# Patient Record
Sex: Female | Born: 1945
Health system: Southern US, Community
[De-identification: ages and names within clinical notes are randomized; demographics above are authoritative.]

## PROBLEM LIST (undated history)

## (undated) DIAGNOSIS — Z9889 Other specified postprocedural states: Secondary | ICD-10-CM

## (undated) DIAGNOSIS — R7303 Prediabetes: Secondary | ICD-10-CM

## (undated) DIAGNOSIS — N2889 Other specified disorders of kidney and ureter: Secondary | ICD-10-CM

## (undated) DIAGNOSIS — N201 Calculus of ureter: Secondary | ICD-10-CM

## (undated) DIAGNOSIS — C801 Malignant (primary) neoplasm, unspecified: Secondary | ICD-10-CM

## (undated) DIAGNOSIS — Z87442 Personal history of urinary calculi: Secondary | ICD-10-CM

## (undated) DIAGNOSIS — R112 Nausea with vomiting, unspecified: Secondary | ICD-10-CM

## (undated) DIAGNOSIS — I700 Atherosclerosis of aorta: Secondary | ICD-10-CM

## (undated) DIAGNOSIS — I10 Essential (primary) hypertension: Secondary | ICD-10-CM

## (undated) DIAGNOSIS — E785 Hyperlipidemia, unspecified: Secondary | ICD-10-CM

## (undated) DIAGNOSIS — R3915 Urgency of urination: Secondary | ICD-10-CM

## (undated) DIAGNOSIS — J189 Pneumonia, unspecified organism: Secondary | ICD-10-CM

## (undated) DIAGNOSIS — M1990 Unspecified osteoarthritis, unspecified site: Secondary | ICD-10-CM

## (undated) DIAGNOSIS — N189 Chronic kidney disease, unspecified: Secondary | ICD-10-CM

## (undated) HISTORY — DX: Atherosclerosis of aorta: I70.0

## (undated) HISTORY — PX: EYE SURGERY: SHX253

## (undated) HISTORY — PX: EXTRACORPOREAL SHOCK WAVE LITHOTRIPSY: SHX1557

## (undated) HISTORY — DX: Hyperlipidemia, unspecified: E78.5

## (undated) HISTORY — PX: CYSTOSCOPY WITH STENT PLACEMENT: SHX5790

## (undated) HISTORY — PX: ABDOMINAL HYSTERECTOMY: SHX81

## (undated) HISTORY — PX: BREAST SURGERY: SHX581

---

## 1976-05-19 HISTORY — PX: KIDNEY SURGERY: SHX687

## 1981-05-19 HISTORY — PX: DILATION AND CURETTAGE OF UTERUS: SHX78

## 1982-05-19 HISTORY — PX: ABDOMINAL HYSTERECTOMY: SHX81

## 1984-05-19 HISTORY — PX: BREAST SURGERY: SHX581

## 2001-01-10 ENCOUNTER — Emergency Department (HOSPITAL_COMMUNITY): Admission: EM | Admit: 2001-01-10 | Discharge: 2001-01-11 | Payer: Self-pay | Admitting: Emergency Medicine

## 2001-01-15 ENCOUNTER — Emergency Department (HOSPITAL_COMMUNITY): Admission: EM | Admit: 2001-01-15 | Discharge: 2001-01-15 | Payer: Self-pay | Admitting: Emergency Medicine

## 2006-04-25 ENCOUNTER — Encounter: Admission: RE | Admit: 2006-04-25 | Discharge: 2006-04-25 | Payer: Self-pay | Admitting: Sports Medicine

## 2006-05-19 HISTORY — PX: ROTATOR CUFF REPAIR: SHX139

## 2009-05-19 HISTORY — PX: LITHOTRIPSY: SUR834

## 2009-10-22 ENCOUNTER — Ambulatory Visit (HOSPITAL_BASED_OUTPATIENT_CLINIC_OR_DEPARTMENT_OTHER): Admission: RE | Admit: 2009-10-22 | Discharge: 2009-10-22 | Payer: Self-pay | Admitting: Urology

## 2009-10-25 ENCOUNTER — Ambulatory Visit (HOSPITAL_COMMUNITY)
Admission: RE | Admit: 2009-10-25 | Discharge: 2009-10-25 | Payer: Self-pay | Source: Home / Self Care | Admitting: Urology

## 2010-08-05 LAB — URINALYSIS, ROUTINE W REFLEX MICROSCOPIC
Bilirubin Urine: NEGATIVE
Ketones, ur: NEGATIVE mg/dL
Nitrite: NEGATIVE
Urobilinogen, UA: 0.2 mg/dL (ref 0.0–1.0)
pH: 7 (ref 5.0–8.0)

## 2010-08-05 LAB — POCT HEMOGLOBIN-HEMACUE: Hemoglobin: 14.2 g/dL (ref 12.0–15.0)

## 2010-08-05 LAB — URINE MICROSCOPIC-ADD ON

## 2011-07-29 DIAGNOSIS — M722 Plantar fascial fibromatosis: Secondary | ICD-10-CM | POA: Diagnosis not present

## 2011-07-29 DIAGNOSIS — M7730 Calcaneal spur, unspecified foot: Secondary | ICD-10-CM | POA: Diagnosis not present

## 2011-08-19 DIAGNOSIS — M722 Plantar fascial fibromatosis: Secondary | ICD-10-CM | POA: Diagnosis not present

## 2011-09-04 DIAGNOSIS — H2510 Age-related nuclear cataract, unspecified eye: Secondary | ICD-10-CM | POA: Diagnosis not present

## 2011-09-04 DIAGNOSIS — H26019 Infantile and juvenile cortical, lamellar, or zonular cataract, unspecified eye: Secondary | ICD-10-CM | POA: Diagnosis not present

## 2011-10-10 DIAGNOSIS — M722 Plantar fascial fibromatosis: Secondary | ICD-10-CM | POA: Diagnosis not present

## 2011-10-10 DIAGNOSIS — Z79899 Other long term (current) drug therapy: Secondary | ICD-10-CM | POA: Diagnosis not present

## 2011-11-07 DIAGNOSIS — M722 Plantar fascial fibromatosis: Secondary | ICD-10-CM | POA: Diagnosis not present

## 2011-11-12 DIAGNOSIS — M722 Plantar fascial fibromatosis: Secondary | ICD-10-CM | POA: Diagnosis not present

## 2011-11-17 DIAGNOSIS — M722 Plantar fascial fibromatosis: Secondary | ICD-10-CM | POA: Diagnosis not present

## 2011-11-19 DIAGNOSIS — M722 Plantar fascial fibromatosis: Secondary | ICD-10-CM | POA: Diagnosis not present

## 2011-11-24 DIAGNOSIS — M722 Plantar fascial fibromatosis: Secondary | ICD-10-CM | POA: Diagnosis not present

## 2011-11-26 DIAGNOSIS — M722 Plantar fascial fibromatosis: Secondary | ICD-10-CM | POA: Diagnosis not present

## 2011-11-28 DIAGNOSIS — M722 Plantar fascial fibromatosis: Secondary | ICD-10-CM | POA: Diagnosis not present

## 2011-12-01 DIAGNOSIS — M722 Plantar fascial fibromatosis: Secondary | ICD-10-CM | POA: Diagnosis not present

## 2011-12-03 DIAGNOSIS — M722 Plantar fascial fibromatosis: Secondary | ICD-10-CM | POA: Diagnosis not present

## 2011-12-05 DIAGNOSIS — M722 Plantar fascial fibromatosis: Secondary | ICD-10-CM | POA: Diagnosis not present

## 2011-12-08 DIAGNOSIS — M722 Plantar fascial fibromatosis: Secondary | ICD-10-CM | POA: Diagnosis not present

## 2011-12-19 DIAGNOSIS — S93409A Sprain of unspecified ligament of unspecified ankle, initial encounter: Secondary | ICD-10-CM | POA: Diagnosis not present

## 2011-12-19 DIAGNOSIS — M779 Enthesopathy, unspecified: Secondary | ICD-10-CM | POA: Diagnosis not present

## 2012-01-16 DIAGNOSIS — M779 Enthesopathy, unspecified: Secondary | ICD-10-CM | POA: Diagnosis not present

## 2012-02-11 DIAGNOSIS — M7750 Other enthesopathy of unspecified foot: Secondary | ICD-10-CM | POA: Diagnosis not present

## 2012-02-11 DIAGNOSIS — M779 Enthesopathy, unspecified: Secondary | ICD-10-CM | POA: Diagnosis not present

## 2012-02-11 DIAGNOSIS — S93409A Sprain of unspecified ligament of unspecified ankle, initial encounter: Secondary | ICD-10-CM | POA: Diagnosis not present

## 2012-02-11 DIAGNOSIS — M19079 Primary osteoarthritis, unspecified ankle and foot: Secondary | ICD-10-CM | POA: Diagnosis not present

## 2012-02-12 ENCOUNTER — Other Ambulatory Visit: Payer: Self-pay

## 2012-02-12 DIAGNOSIS — R52 Pain, unspecified: Secondary | ICD-10-CM

## 2012-02-16 ENCOUNTER — Ambulatory Visit
Admission: RE | Admit: 2012-02-16 | Discharge: 2012-02-16 | Disposition: A | Discharge status: Home / Self Care | Payer: Medicare Other | Source: Ambulatory Visit

## 2012-02-16 DIAGNOSIS — M19079 Primary osteoarthritis, unspecified ankle and foot: Secondary | ICD-10-CM | POA: Diagnosis not present

## 2012-02-16 DIAGNOSIS — R52 Pain, unspecified: Secondary | ICD-10-CM

## 2012-02-27 DIAGNOSIS — M779 Enthesopathy, unspecified: Secondary | ICD-10-CM | POA: Diagnosis not present

## 2012-03-19 DIAGNOSIS — M19079 Primary osteoarthritis, unspecified ankle and foot: Secondary | ICD-10-CM | POA: Diagnosis not present

## 2012-03-19 DIAGNOSIS — M779 Enthesopathy, unspecified: Secondary | ICD-10-CM | POA: Diagnosis not present

## 2012-03-19 DIAGNOSIS — Z79899 Other long term (current) drug therapy: Secondary | ICD-10-CM | POA: Diagnosis not present

## 2012-04-29 DIAGNOSIS — N200 Calculus of kidney: Secondary | ICD-10-CM | POA: Diagnosis not present

## 2012-04-29 DIAGNOSIS — N3941 Urge incontinence: Secondary | ICD-10-CM | POA: Diagnosis not present

## 2012-05-04 DIAGNOSIS — M779 Enthesopathy, unspecified: Secondary | ICD-10-CM | POA: Diagnosis not present

## 2012-05-04 DIAGNOSIS — M19079 Primary osteoarthritis, unspecified ankle and foot: Secondary | ICD-10-CM | POA: Diagnosis not present

## 2012-05-19 HISTORY — PX: FOOT SURGERY: SHX648

## 2012-06-02 DIAGNOSIS — M779 Enthesopathy, unspecified: Secondary | ICD-10-CM | POA: Diagnosis not present

## 2012-06-02 DIAGNOSIS — M19079 Primary osteoarthritis, unspecified ankle and foot: Secondary | ICD-10-CM | POA: Diagnosis not present

## 2012-06-04 DIAGNOSIS — I10 Essential (primary) hypertension: Secondary | ICD-10-CM | POA: Diagnosis not present

## 2012-06-18 DIAGNOSIS — I10 Essential (primary) hypertension: Secondary | ICD-10-CM | POA: Diagnosis not present

## 2012-06-18 DIAGNOSIS — Z01818 Encounter for other preprocedural examination: Secondary | ICD-10-CM | POA: Diagnosis not present

## 2012-07-07 DIAGNOSIS — E876 Hypokalemia: Secondary | ICD-10-CM | POA: Diagnosis not present

## 2012-07-07 DIAGNOSIS — I10 Essential (primary) hypertension: Secondary | ICD-10-CM | POA: Diagnosis not present

## 2012-07-08 DIAGNOSIS — D485 Neoplasm of uncertain behavior of skin: Secondary | ICD-10-CM | POA: Diagnosis not present

## 2012-07-08 DIAGNOSIS — L570 Actinic keratosis: Secondary | ICD-10-CM | POA: Diagnosis not present

## 2012-07-26 DIAGNOSIS — M19079 Primary osteoarthritis, unspecified ankle and foot: Secondary | ICD-10-CM | POA: Diagnosis not present

## 2012-07-26 DIAGNOSIS — I10 Essential (primary) hypertension: Secondary | ICD-10-CM | POA: Diagnosis not present

## 2012-07-26 DIAGNOSIS — M25579 Pain in unspecified ankle and joints of unspecified foot: Secondary | ICD-10-CM | POA: Diagnosis not present

## 2012-07-26 DIAGNOSIS — M779 Enthesopathy, unspecified: Secondary | ICD-10-CM | POA: Diagnosis not present

## 2012-07-26 DIAGNOSIS — M205X9 Other deformities of toe(s) (acquired), unspecified foot: Secondary | ICD-10-CM | POA: Diagnosis not present

## 2012-08-04 DIAGNOSIS — M779 Enthesopathy, unspecified: Secondary | ICD-10-CM | POA: Diagnosis not present

## 2012-08-19 DIAGNOSIS — L570 Actinic keratosis: Secondary | ICD-10-CM | POA: Diagnosis not present

## 2012-08-31 DIAGNOSIS — M779 Enthesopathy, unspecified: Secondary | ICD-10-CM | POA: Diagnosis not present

## 2012-09-28 DIAGNOSIS — M779 Enthesopathy, unspecified: Secondary | ICD-10-CM | POA: Diagnosis not present

## 2012-10-04 DIAGNOSIS — H2510 Age-related nuclear cataract, unspecified eye: Secondary | ICD-10-CM | POA: Diagnosis not present

## 2012-10-04 DIAGNOSIS — H02829 Cysts of unspecified eye, unspecified eyelid: Secondary | ICD-10-CM | POA: Diagnosis not present

## 2012-11-23 DIAGNOSIS — G589 Mononeuropathy, unspecified: Secondary | ICD-10-CM | POA: Diagnosis not present

## 2012-11-23 DIAGNOSIS — R609 Edema, unspecified: Secondary | ICD-10-CM | POA: Diagnosis not present

## 2012-11-23 DIAGNOSIS — M722 Plantar fascial fibromatosis: Secondary | ICD-10-CM | POA: Diagnosis not present

## 2012-12-17 DIAGNOSIS — M779 Enthesopathy, unspecified: Secondary | ICD-10-CM | POA: Diagnosis not present

## 2012-12-17 DIAGNOSIS — M79609 Pain in unspecified limb: Secondary | ICD-10-CM | POA: Diagnosis not present

## 2012-12-17 DIAGNOSIS — M19079 Primary osteoarthritis, unspecified ankle and foot: Secondary | ICD-10-CM | POA: Diagnosis not present

## 2012-12-17 DIAGNOSIS — M722 Plantar fascial fibromatosis: Secondary | ICD-10-CM | POA: Diagnosis not present

## 2013-02-11 DIAGNOSIS — M722 Plantar fascial fibromatosis: Secondary | ICD-10-CM | POA: Diagnosis not present

## 2013-02-11 DIAGNOSIS — M19079 Primary osteoarthritis, unspecified ankle and foot: Secondary | ICD-10-CM | POA: Diagnosis not present

## 2013-02-11 DIAGNOSIS — M7730 Calcaneal spur, unspecified foot: Secondary | ICD-10-CM | POA: Diagnosis not present

## 2013-02-11 DIAGNOSIS — M779 Enthesopathy, unspecified: Secondary | ICD-10-CM | POA: Diagnosis not present

## 2013-03-18 ENCOUNTER — Ambulatory Visit (INDEPENDENT_AMBULATORY_CARE_PROVIDER_SITE_OTHER): Payer: Medicare Other

## 2013-03-18 VITALS — BP 162/79 | HR 64 | Resp 12 | Ht 64.0 in | Wt 190.0 lb

## 2013-03-18 DIAGNOSIS — M79609 Pain in unspecified limb: Secondary | ICD-10-CM

## 2013-03-18 DIAGNOSIS — M1990 Unspecified osteoarthritis, unspecified site: Secondary | ICD-10-CM

## 2013-03-18 DIAGNOSIS — M722 Plantar fascial fibromatosis: Secondary | ICD-10-CM | POA: Diagnosis not present

## 2013-03-18 MED ORDER — PREDNISONE 10 MG PO KIT: 1.0000 | PACK | Freq: Four times a day (QID) | ORAL | Status: DC

## 2013-03-18 NOTE — Progress Notes (Signed)
  Subjective:    Patient ID: Elaine Harris, female    DOB: 1945-08-26, 67 y.o.   MRN: [id number]  HPI Comments: '' B/L HEEL ARE SORE, THE RT. FOOT IS WORSE' ' I WEAR MY ORTHOTICS, ICING, AND IBUFROPEN, BUT IS NOT GETTING BETTER''  patient is status post lateral column fusion on the right foot continues to have some stiffness and soreness is not constant but is aggravated with certain activities. She is wearing boots which is working out in the yard and maintains use of her orthotics. Continues to have significant pain on palpation Magan plantar fascia medial calcaneal tubercle right significantly worse than left at today's presentation.    Review of Systems  Constitutional: Negative.   Eyes: Positive for visual disturbance.  Respiratory: Negative.   Cardiovascular: Negative.   Gastrointestinal: Negative.   Endocrine: Negative.   Genitourinary: Negative.   Musculoskeletal: Negative.   Skin: Negative.   Allergic/Immunologic: Negative.   Neurological: Negative.   Hematological: Negative.   Psychiatric/Behavioral: Negative.        Objective:   Physical Exam Neurovascular status is intact. Pedal pulses palpable DP and PT +2/4 bilateral. Capillary refill time 3 seconds all digits. Epicritic and proprioceptive sensations intact and symmetric bilateral with normal plantar response and DTRs noted. Orthopedic biomechanical exam reveals rectus foot type mild flexible digital contractures noted. Slight cavus foot type left more so than right. Patient definitely has some arthropathy the forefoot midfoot area exacerbated by walking and likely with weather changes especially with cold and wet weather setting in.    Assessment & Plan:  Plantar fasciitis/heel spur syndrome bilateral right significantly worse than left. Fascial strapping applied to both feet at this time. Patient placed in a Sterapred Deas Dosepak x12 days will also maintain ice pack to the inferior heel is bilateral maintain  current orthoses recheck again in 3 or 4 weeks for followup future treatment may consist of steroid injections may discuss other options including shockwave her a pep therapies or even surgical intervention may need to be discussed.  Alvan Dame DPM

## 2013-03-18 NOTE — Patient Instructions (Signed)

## 2013-03-24 ENCOUNTER — Other Ambulatory Visit: Payer: Self-pay

## 2013-04-19 DIAGNOSIS — N200 Calculus of kidney: Secondary | ICD-10-CM | POA: Diagnosis not present

## 2013-04-22 ENCOUNTER — Ambulatory Visit: Payer: Medicare Other

## 2013-06-24 DIAGNOSIS — I10 Essential (primary) hypertension: Secondary | ICD-10-CM | POA: Diagnosis not present

## 2013-06-24 DIAGNOSIS — L680 Hirsutism: Secondary | ICD-10-CM | POA: Diagnosis not present

## 2013-10-21 DIAGNOSIS — Z23 Encounter for immunization: Secondary | ICD-10-CM | POA: Diagnosis not present

## 2013-10-21 DIAGNOSIS — Z1331 Encounter for screening for depression: Secondary | ICD-10-CM | POA: Diagnosis not present

## 2013-10-21 DIAGNOSIS — Z1211 Encounter for screening for malignant neoplasm of colon: Secondary | ICD-10-CM | POA: Diagnosis not present

## 2013-10-21 DIAGNOSIS — Z6836 Body mass index (BMI) 36.0-36.9, adult: Secondary | ICD-10-CM | POA: Diagnosis not present

## 2013-10-21 DIAGNOSIS — I10 Essential (primary) hypertension: Secondary | ICD-10-CM | POA: Diagnosis not present

## 2013-10-21 DIAGNOSIS — Z0000 Encounter for general adult medical examination without abnormal findings: Secondary | ICD-10-CM | POA: Diagnosis not present

## 2013-10-21 DIAGNOSIS — Z780 Asymptomatic menopausal state: Secondary | ICD-10-CM | POA: Diagnosis not present

## 2013-10-26 ENCOUNTER — Other Ambulatory Visit: Payer: Self-pay

## 2013-10-26 DIAGNOSIS — Z1231 Encounter for screening mammogram for malignant neoplasm of breast: Secondary | ICD-10-CM

## 2013-10-31 DIAGNOSIS — Z1211 Encounter for screening for malignant neoplasm of colon: Secondary | ICD-10-CM | POA: Diagnosis not present

## 2013-11-01 ENCOUNTER — Ambulatory Visit
Admission: RE | Admit: 2013-11-01 | Discharge: 2013-11-01 | Disposition: A | Discharge status: Home / Self Care | Payer: Medicare Other | Source: Ambulatory Visit

## 2013-11-01 DIAGNOSIS — Z1231 Encounter for screening mammogram for malignant neoplasm of breast: Secondary | ICD-10-CM

## 2013-11-04 DIAGNOSIS — Z1211 Encounter for screening for malignant neoplasm of colon: Secondary | ICD-10-CM | POA: Diagnosis not present

## 2013-11-24 DIAGNOSIS — H26019 Infantile and juvenile cortical, lamellar, or zonular cataract, unspecified eye: Secondary | ICD-10-CM | POA: Diagnosis not present

## 2013-11-24 DIAGNOSIS — H02829 Cysts of unspecified eye, unspecified eyelid: Secondary | ICD-10-CM | POA: Diagnosis not present

## 2013-11-24 DIAGNOSIS — H2510 Age-related nuclear cataract, unspecified eye: Secondary | ICD-10-CM | POA: Diagnosis not present

## 2013-11-28 DIAGNOSIS — Z780 Asymptomatic menopausal state: Secondary | ICD-10-CM | POA: Diagnosis not present

## 2014-04-21 DIAGNOSIS — M2550 Pain in unspecified joint: Secondary | ICD-10-CM | POA: Diagnosis not present

## 2014-04-21 DIAGNOSIS — N200 Calculus of kidney: Secondary | ICD-10-CM | POA: Diagnosis not present

## 2014-04-21 DIAGNOSIS — I10 Essential (primary) hypertension: Secondary | ICD-10-CM | POA: Diagnosis not present

## 2014-04-28 DIAGNOSIS — R312 Other microscopic hematuria: Secondary | ICD-10-CM | POA: Diagnosis not present

## 2014-04-28 DIAGNOSIS — N200 Calculus of kidney: Secondary | ICD-10-CM | POA: Diagnosis not present

## 2014-05-19 HISTORY — PX: CATARACT EXTRACTION W/ INTRAOCULAR LENS  IMPLANT, BILATERAL: SHX1307

## 2014-07-07 ENCOUNTER — Other Ambulatory Visit: Payer: Self-pay | Admitting: Urology

## 2014-07-07 DIAGNOSIS — N200 Calculus of kidney: Secondary | ICD-10-CM | POA: Diagnosis not present

## 2014-07-07 DIAGNOSIS — R312 Other microscopic hematuria: Secondary | ICD-10-CM | POA: Diagnosis not present

## 2014-07-12 ENCOUNTER — Encounter (HOSPITAL_COMMUNITY): Payer: Self-pay

## 2014-07-14 NOTE — H&P (Signed)
Reason For Visit Here today for left ESWLsecondary to stones in the renal pelvis.  Active Problems Problems  1. Microscopic hematuria (R31.2)   Assessed By: Jimmey Ralph (Urology); Last Assessed: 07 Jul 2014 2. Nephrolithiasis (N20.0)   Assessed By: Jimmey Ralph (Urology); Last Assessed: 07 Jul 2014  History of Present Illness 69 YO female patient of Dr. Cy Blamer seen today for a 2 month f/u.     GU Hx:  In 2011, she had placement of a double-J stent for approximately a 16 mm stone in her left ureteropelvic junction, causing hydronephrosis. She subsequently had lithotripsy. The majority of the stone passed, but she was known to have a small fragment in the lower pole of her left kidney. She also had an untreated 9-10 mm stone that was laterally based in the mid pole of the kidney. Believe that it is in a calyx out laterally and probably is not going to migrate. When she was here 2014, she was doing well and there did not appear to be much change over several years. She continued to have a stone or potentially 2 stones in the lower pole of the left kidney, which appeared to be about 4 mm in size. She has had occasional achiness in her left flank, but nothing that has been problematic or consistent. Urinalysis today does show some microhematuria.    KUB Dec 2015. She continues to have a dense 9-10 mm laterally based stone in the mid pole of the left kidney, unchanged. The fragment that was in her lower pole, now appears to be more in the area of the renal pelvis. It is unclear whether there are 2 adjacent stones or 1 larger stone. In total, the size appears to be around 4 mm x 8 mm.     Feb 2016 Interval Hx:   Today states no real change since last visit. Denies worsening flank pain or hematuria. Has not seen any stone material.   Past Medical History Problems  1. History of Borderline osteopenia (M85.80)  Surgical History Problems  1. History of Cystoscopy With Insertion Of  Ureteral Stent Left 2. History of Hysterectomy 3. History of Lithotripsy 4. History of Oophorectomy 5. History of Shoulder Surgery  Current Meds 1. Calcium + D TABS;  Therapy: (Recorded:11Dec2015) to Recorded 2. Fish Oil CAPS;  Therapy: (Recorded:12May2011) to Recorded 3. Lisinopril 20 MG Oral Tablet;  Therapy: 20Oct2014 to Recorded 4. Multi-Day TABS;  Therapy: (Recorded:12May2011) to Recorded  Allergies Medication  1. No Known Drug Allergies  Family History Problems  1. Family history of Acute Myocardial Infarction : Mother 2. Family history of Acute Myocardial Infarction : Father 3. Family history of Family Health Status Number Of Children   2 sons 4. Family history of Father Deceased At Age ____   53y/o, lung ca 5. Family history of Lung Cancer : Father 84. Family history of Mother Deceased At Age ____   84y/o  Social History Problems  1. Denied: History of Alcohol Use 2. Caffeine Use   1 qd 3. Marital History - Currently Married 4. Never A Smoker 5. Occupation:   retired  Review of Systems Genitourinary, constitutional, skin, eye, otolaryngeal, hematologic/lymphatic, cardiovascular, pulmonary, endocrine, musculoskeletal, gastrointestinal, neurological and psychiatric system(s) were reviewed and pertinent findings if present are noted and are otherwise negative.  Gastrointestinal: flank pain.    Vitals Vital Signs [Data Includes: Last 1 Day]  Recorded: [id number] 08:39AM  Blood Pressure: 149 / 82 Temperature: 97.7 F Heart Rate: 73  Physical Exam Constitutional:  Well nourished and well developed . No acute distress. The patient appears well hydrated.    Class I  Soft palate, anterior &posterior tonsil pillars and uvula present  Class II  Tonsil pillars & base of uvula hidden by base of tongue  Class III  Only soft palate visible  Class IV  Soft palate not visible ENT:1 . Class III1 .  Abdomen: The abdomen is mildly obese. The abdomen is soft and  nontender. No suprapubic tenderness. No CVA tenderness.  Skin: Normal skin turgor.  Neuro/Psych:. Mood and affect are appropriate.     1 Amended By: Jimmey Ralph; Jul 07 2014 11:25 AM EST  Results/Data Urine Belva Bertin Includes: Last 1 Day]   [id number]  COLOR YELLOW   APPEARANCE CLEAR   SPECIFIC GRAVITY 1.025   pH 6.0   GLUCOSE NEG mg/dL  BILIRUBIN NEG   KETONE NEG mg/dL  BLOOD TRACE   PROTEIN 30 mg/dL  UROBILINOGEN 0.2 mg/dL  NITRITE NEG   LEUKOCYTE ESTERASE NEG   SQUAMOUS EPITHELIAL/HPF MANY   WBC 3-6 WBC/hpf  RBC 3-6 RBC/hpf  BACTERIA FEW   CRYSTALS NONE SEEN   CASTS NONE SEEN    The following images/tracing/specimen were independently visualized:  KUB: shows stable 9 mm renal stone and now has 2 stones noted at UPJ. RUS: (limited) left: 12.54 X 1.16 X 5.14 X 5.29 cm. No hydronephrosis. Multiple stones noted. Mid renal pelvis cyst: 2.04 X 1.25 X 1.87 cm.  The following clinical lab reports were reviewed:  UA- with small amount of microscopic hematuria.  PVR: Ultrasound PVR 6.78. Bladder wall: 0.68 cm. ml.    Assessment Assessed  1. Nephrolithiasis (N20.0) 2. Microscopic hematuria (R31.2)  Plan Nephrolithiasis  1. Follow-up NP/PA [facility]  Follow-up  Status: Active  Requested for: [id number]  Will proceed with elective ESWL with Dr. Risa Grill.   Discussed with pt and husband. She is very leary about proceeding with any treatment option at this time. I told her to think about it and when [employer] (scheduler) calls her to scheduled she can let her know if she is going forward. If she chooses not to proceed will need to f/u in 3 months for OV/KUB w/Dr. Risa Grill.   Signatures Electronically signed by : Jimmey Ralph, Trey Paula; Jul 07 2014 11:26AM EST

## 2014-07-17 ENCOUNTER — Ambulatory Visit (HOSPITAL_COMMUNITY): Payer: Medicare Other

## 2014-07-17 ENCOUNTER — Ambulatory Visit (HOSPITAL_COMMUNITY)
Admission: RE | Admit: 2014-07-17 | Discharge: 2014-07-17 | Disposition: A | Discharge status: Home / Self Care | Payer: Medicare Other | Source: Ambulatory Visit | Attending: Urology | Admitting: Urology

## 2014-07-17 ENCOUNTER — Encounter (HOSPITAL_COMMUNITY): Payer: Self-pay | Admitting: *Deleted

## 2014-07-17 ENCOUNTER — Encounter (HOSPITAL_COMMUNITY)
Admission: RE | Disposition: A | Discharge status: Home / Self Care | Payer: Self-pay | Source: Ambulatory Visit | Attending: Urology

## 2014-07-17 DIAGNOSIS — I10 Essential (primary) hypertension: Secondary | ICD-10-CM | POA: Diagnosis not present

## 2014-07-17 DIAGNOSIS — Z01818 Encounter for other preprocedural examination: Secondary | ICD-10-CM | POA: Diagnosis not present

## 2014-07-17 DIAGNOSIS — N200 Calculus of kidney: Secondary | ICD-10-CM | POA: Diagnosis not present

## 2014-07-17 DIAGNOSIS — N201 Calculus of ureter: Secondary | ICD-10-CM

## 2014-07-17 HISTORY — DX: Other specified postprocedural states: Z98.890

## 2014-07-17 HISTORY — DX: Essential (primary) hypertension: I10

## 2014-07-17 HISTORY — DX: Chronic kidney disease, unspecified: N18.9

## 2014-07-17 HISTORY — DX: Other specified postprocedural states: R11.2

## 2014-07-17 SURGERY — LITHOTRIPSY, ESWL
Anesthesia: LOCAL | Laterality: Left

## 2014-07-17 MED ORDER — DEXTROSE-NACL 5-0.45 % IV SOLN
INTRAVENOUS | Status: DC
  Administered 2014-07-17: 09:00:00 via INTRAVENOUS

## 2014-07-17 MED ORDER — DIPHENHYDRAMINE HCL 25 MG PO CAPS
25.0000 mg | ORAL_CAPSULE | ORAL | Status: AC
  Administered 2014-07-17: 25 mg via ORAL
  Filled 2014-07-17: qty 1

## 2014-07-17 MED ORDER — DIAZEPAM 5 MG PO TABS
10.0000 mg | ORAL_TABLET | ORAL | Status: AC
  Administered 2014-07-17: 10 mg via ORAL
  Filled 2014-07-17: qty 2

## 2014-07-17 MED ORDER — CIPROFLOXACIN HCL 500 MG PO TABS
500.0000 mg | ORAL_TABLET | ORAL | Status: AC
  Administered 2014-07-17: 500 mg via ORAL
  Filled 2014-07-17: qty 1

## 2014-07-17 MED ORDER — HYDROCODONE-ACETAMINOPHEN 5-325 MG PO TABS: 1.0000 | ORAL_TABLET | Freq: Four times a day (QID) | ORAL | Status: DC | PRN

## 2014-07-17 NOTE — Interval H&P Note (Signed)
History and Physical Interval Note:  07/17/2014 9:31 AM  Elaine Harris  has presented today for surgery, with the diagnosis of LEFT URETERAL PELVIC JUNCTION STONE  The various methods of treatment have been discussed with the patient and family. After consideration of risks, benefits and other options for treatment, the patient has consented to  Procedure(s): LEFT EXTRACORPOREAL SHOCK WAVE LITHOTRIPSY (ESWL) (Left) as a surgical intervention .  The patient's history has been reviewed, patient examined, no change in status, stable for surgery.  I have reviewed the patient's chart and labs.  Questions were answered to the patient's satisfaction.     Elaine Harris S

## 2014-07-17 NOTE — Discharge Instructions (Signed)
See [facility] discharge instructions in chart.  

## 2014-07-17 NOTE — Op Note (Signed)
See [facility] OP note scanned into chart. 

## 2014-08-08 DIAGNOSIS — N200 Calculus of kidney: Secondary | ICD-10-CM | POA: Diagnosis not present

## 2014-08-21 DIAGNOSIS — N200 Calculus of kidney: Secondary | ICD-10-CM | POA: Diagnosis not present

## 2014-09-21 DIAGNOSIS — N200 Calculus of kidney: Secondary | ICD-10-CM | POA: Diagnosis not present

## 2014-09-26 ENCOUNTER — Other Ambulatory Visit: Payer: Self-pay

## 2014-09-26 DIAGNOSIS — Z1231 Encounter for screening mammogram for malignant neoplasm of breast: Secondary | ICD-10-CM

## 2014-10-27 DIAGNOSIS — Z0000 Encounter for general adult medical examination without abnormal findings: Secondary | ICD-10-CM | POA: Diagnosis not present

## 2014-10-27 DIAGNOSIS — Z1389 Encounter for screening for other disorder: Secondary | ICD-10-CM | POA: Diagnosis not present

## 2014-10-27 DIAGNOSIS — N200 Calculus of kidney: Secondary | ICD-10-CM | POA: Diagnosis not present

## 2014-10-27 DIAGNOSIS — I10 Essential (primary) hypertension: Secondary | ICD-10-CM | POA: Diagnosis not present

## 2014-10-27 DIAGNOSIS — E785 Hyperlipidemia, unspecified: Secondary | ICD-10-CM | POA: Diagnosis not present

## 2014-10-27 DIAGNOSIS — E559 Vitamin D deficiency, unspecified: Secondary | ICD-10-CM | POA: Diagnosis not present

## 2014-10-27 DIAGNOSIS — Z23 Encounter for immunization: Secondary | ICD-10-CM | POA: Diagnosis not present

## 2014-11-03 ENCOUNTER — Ambulatory Visit
Admission: RE | Admit: 2014-11-03 | Discharge: 2014-11-03 | Disposition: A | Discharge status: Home / Self Care | Payer: Federal, State, Local not specified - PPO | Source: Ambulatory Visit

## 2014-11-03 DIAGNOSIS — Z1231 Encounter for screening mammogram for malignant neoplasm of breast: Secondary | ICD-10-CM

## 2014-11-13 ENCOUNTER — Other Ambulatory Visit: Payer: Self-pay

## 2014-12-25 DIAGNOSIS — H2513 Age-related nuclear cataract, bilateral: Secondary | ICD-10-CM | POA: Diagnosis not present

## 2014-12-25 DIAGNOSIS — H25013 Cortical age-related cataract, bilateral: Secondary | ICD-10-CM | POA: Diagnosis not present

## 2014-12-27 DIAGNOSIS — H25012 Cortical age-related cataract, left eye: Secondary | ICD-10-CM | POA: Diagnosis not present

## 2015-01-10 DIAGNOSIS — H25011 Cortical age-related cataract, right eye: Secondary | ICD-10-CM | POA: Diagnosis not present

## 2015-01-10 DIAGNOSIS — H25012 Cortical age-related cataract, left eye: Secondary | ICD-10-CM | POA: Diagnosis not present

## 2015-01-10 DIAGNOSIS — H2512 Age-related nuclear cataract, left eye: Secondary | ICD-10-CM | POA: Diagnosis not present

## 2015-01-10 DIAGNOSIS — H21562 Pupillary abnormality, left eye: Secondary | ICD-10-CM | POA: Diagnosis not present

## 2015-01-10 DIAGNOSIS — H5703 Miosis: Secondary | ICD-10-CM | POA: Diagnosis not present

## 2015-01-17 DIAGNOSIS — H2511 Age-related nuclear cataract, right eye: Secondary | ICD-10-CM | POA: Diagnosis not present

## 2015-01-17 DIAGNOSIS — H25011 Cortical age-related cataract, right eye: Secondary | ICD-10-CM | POA: Diagnosis not present

## 2015-06-04 DIAGNOSIS — Z961 Presence of intraocular lens: Secondary | ICD-10-CM | POA: Diagnosis not present

## 2015-09-28 ENCOUNTER — Other Ambulatory Visit: Payer: Self-pay

## 2015-09-28 DIAGNOSIS — Z1231 Encounter for screening mammogram for malignant neoplasm of breast: Secondary | ICD-10-CM

## 2015-11-05 ENCOUNTER — Ambulatory Visit
Admission: RE | Admit: 2015-11-05 | Discharge: 2015-11-05 | Disposition: A | Discharge status: Home / Self Care | Payer: Federal, State, Local not specified - PPO | Source: Ambulatory Visit

## 2015-11-05 DIAGNOSIS — Z1231 Encounter for screening mammogram for malignant neoplasm of breast: Secondary | ICD-10-CM | POA: Diagnosis not present

## 2016-02-11 ENCOUNTER — Ambulatory Visit
Admission: RE | Admit: 2016-02-11 | Discharge: 2016-02-11 | Disposition: A | Discharge status: Home / Self Care | Payer: Federal, State, Local not specified - PPO | Source: Ambulatory Visit

## 2016-02-11 ENCOUNTER — Other Ambulatory Visit: Payer: Self-pay

## 2016-02-11 DIAGNOSIS — E785 Hyperlipidemia, unspecified: Secondary | ICD-10-CM | POA: Diagnosis not present

## 2016-02-11 DIAGNOSIS — M542 Cervicalgia: Secondary | ICD-10-CM | POA: Diagnosis not present

## 2016-02-11 DIAGNOSIS — M62838 Other muscle spasm: Secondary | ICD-10-CM | POA: Diagnosis not present

## 2016-02-11 DIAGNOSIS — I10 Essential (primary) hypertension: Secondary | ICD-10-CM | POA: Diagnosis not present

## 2016-02-21 IMAGING — CR DG ABDOMEN 1V
1 series · 1 of 1 positions shown · non-contrast
Comparison: 07/07/2014.

CLINICAL DATA: Preop, left kidney stone.

EXAM:
ABDOMEN - 1 VIEW

[t abdomen supine]
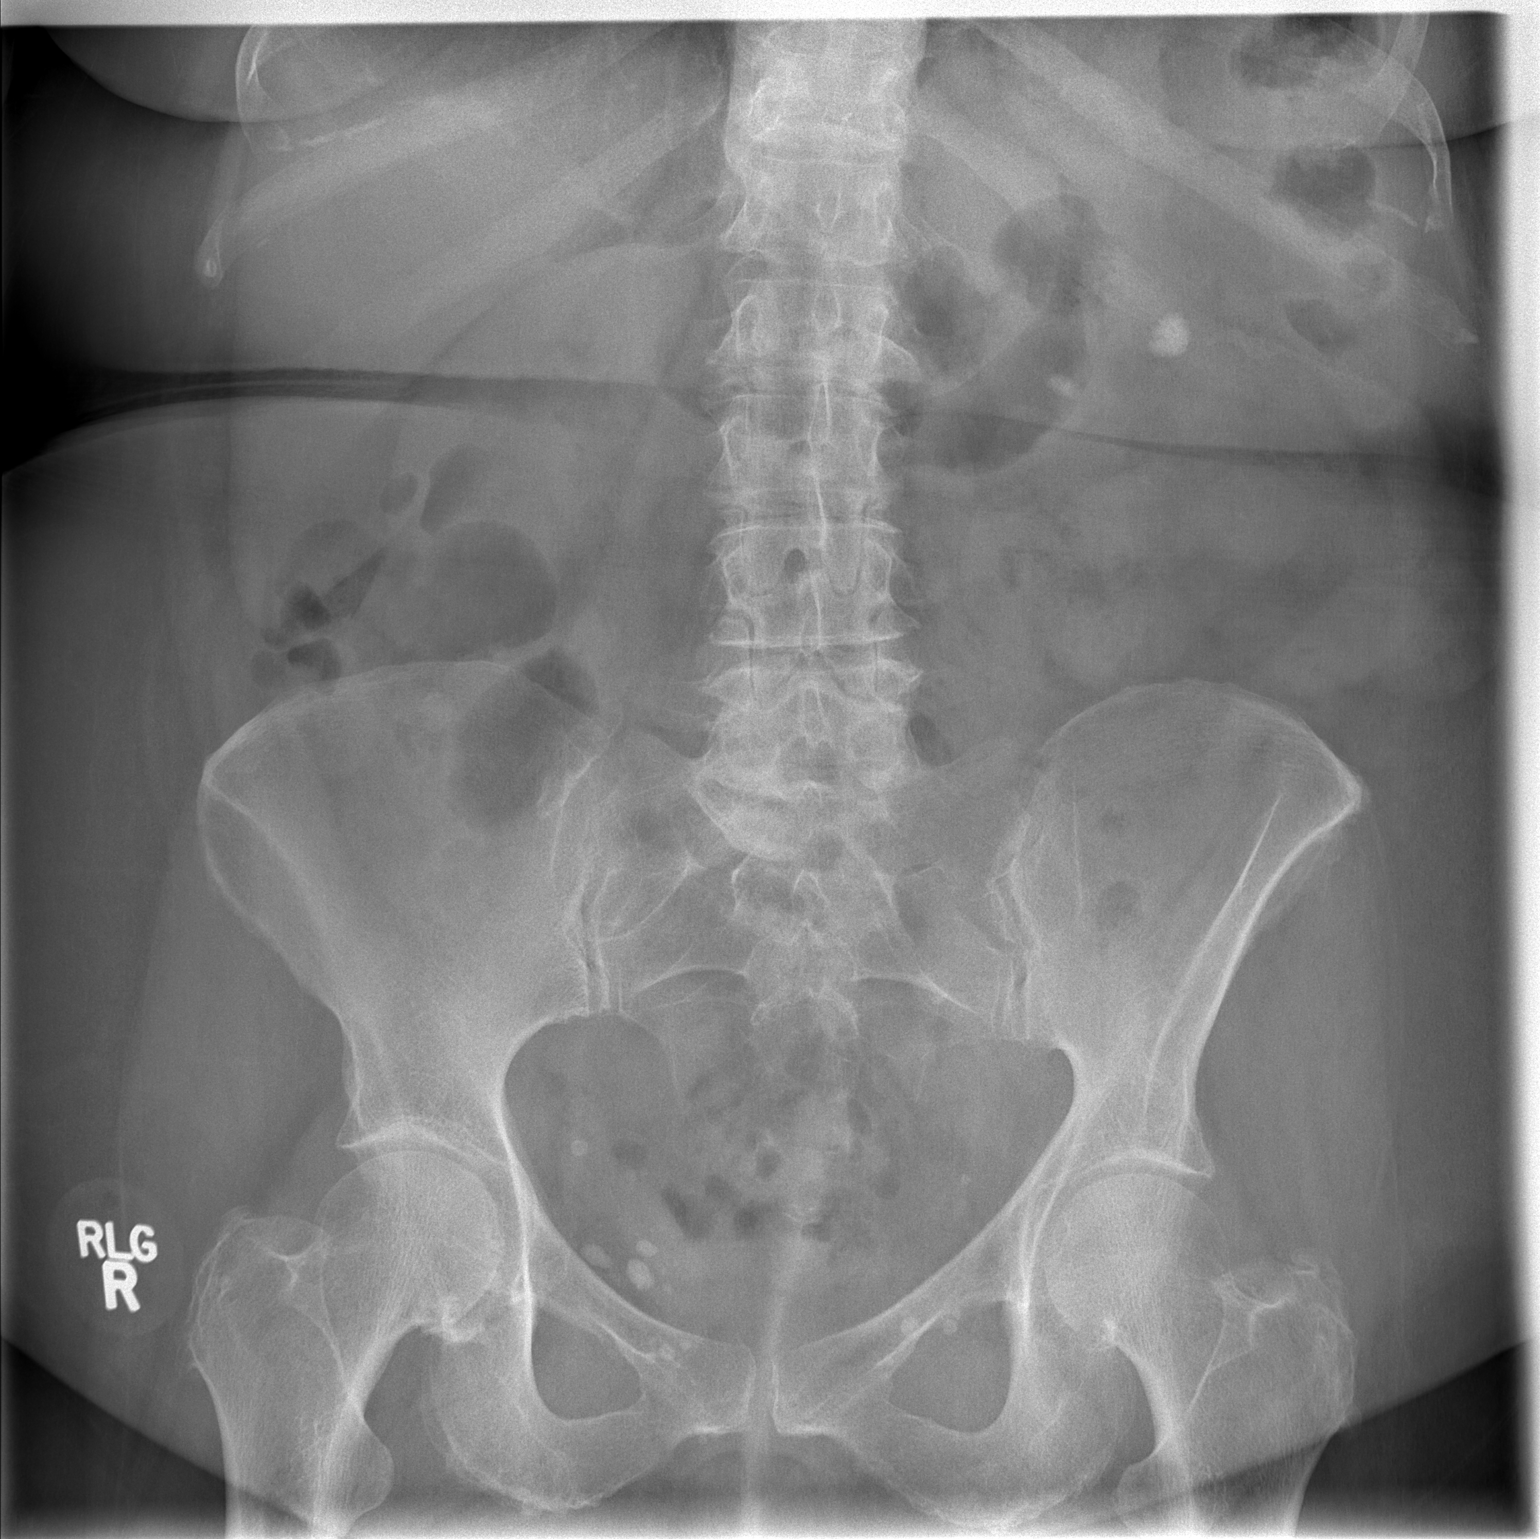

[1 of 1 positions shown; findings below may reference images not displayed]

FINDINGS: A 1.1 x 1.4 cm calcification projects over the left renal outline. A
9 mm calcification projects in the expected location of left renal
pelvis. There may be a tiny stone in the lower pole left kidney.
Findings are stable from 07/07/2014. No definite radiopaque calculi
project over the right renal outline or expected course of the
ureters. Phleboliths in the anatomic pelvis. Normal bowel gas
pattern.
IMPRESSION: Left renal stones.

## 2016-03-19 DIAGNOSIS — R7302 Impaired glucose tolerance (oral): Secondary | ICD-10-CM | POA: Diagnosis not present

## 2016-03-19 DIAGNOSIS — E785 Hyperlipidemia, unspecified: Secondary | ICD-10-CM | POA: Diagnosis not present

## 2016-03-19 DIAGNOSIS — M8580 Other specified disorders of bone density and structure, unspecified site: Secondary | ICD-10-CM | POA: Diagnosis not present

## 2016-03-19 DIAGNOSIS — Z1389 Encounter for screening for other disorder: Secondary | ICD-10-CM | POA: Diagnosis not present

## 2016-03-19 DIAGNOSIS — I10 Essential (primary) hypertension: Secondary | ICD-10-CM | POA: Diagnosis not present

## 2016-03-19 DIAGNOSIS — E669 Obesity, unspecified: Secondary | ICD-10-CM | POA: Diagnosis not present

## 2016-03-19 DIAGNOSIS — Z6835 Body mass index (BMI) 35.0-35.9, adult: Secondary | ICD-10-CM | POA: Diagnosis not present

## 2016-03-19 DIAGNOSIS — Z0000 Encounter for general adult medical examination without abnormal findings: Secondary | ICD-10-CM | POA: Diagnosis not present

## 2016-05-01 DIAGNOSIS — M8588 Other specified disorders of bone density and structure, other site: Secondary | ICD-10-CM | POA: Diagnosis not present

## 2016-05-01 DIAGNOSIS — E2839 Other primary ovarian failure: Secondary | ICD-10-CM | POA: Diagnosis not present

## 2016-05-01 DIAGNOSIS — Z780 Asymptomatic menopausal state: Secondary | ICD-10-CM | POA: Diagnosis not present

## 2016-05-02 DIAGNOSIS — Z1211 Encounter for screening for malignant neoplasm of colon: Secondary | ICD-10-CM | POA: Diagnosis not present

## 2016-06-11 DIAGNOSIS — I10 Essential (primary) hypertension: Secondary | ICD-10-CM | POA: Diagnosis not present

## 2016-06-11 DIAGNOSIS — H9012 Conductive hearing loss, unilateral, left ear, with unrestricted hearing on the contralateral side: Secondary | ICD-10-CM | POA: Diagnosis not present

## 2016-06-11 DIAGNOSIS — H6982 Other specified disorders of Eustachian tube, left ear: Secondary | ICD-10-CM | POA: Diagnosis not present

## 2016-06-19 DIAGNOSIS — H52203 Unspecified astigmatism, bilateral: Secondary | ICD-10-CM | POA: Diagnosis not present

## 2016-06-19 DIAGNOSIS — H5213 Myopia, bilateral: Secondary | ICD-10-CM | POA: Diagnosis not present

## 2016-06-19 DIAGNOSIS — Z961 Presence of intraocular lens: Secondary | ICD-10-CM | POA: Diagnosis not present

## 2016-06-19 DIAGNOSIS — H524 Presbyopia: Secondary | ICD-10-CM | POA: Diagnosis not present

## 2016-08-04 DIAGNOSIS — H6983 Other specified disorders of Eustachian tube, bilateral: Secondary | ICD-10-CM | POA: Diagnosis not present

## 2016-09-09 DIAGNOSIS — H6983 Other specified disorders of Eustachian tube, bilateral: Secondary | ICD-10-CM | POA: Diagnosis not present

## 2016-09-16 DIAGNOSIS — J301 Allergic rhinitis due to pollen: Secondary | ICD-10-CM | POA: Diagnosis not present

## 2016-09-16 DIAGNOSIS — I10 Essential (primary) hypertension: Secondary | ICD-10-CM | POA: Diagnosis not present

## 2016-09-16 DIAGNOSIS — E785 Hyperlipidemia, unspecified: Secondary | ICD-10-CM | POA: Diagnosis not present

## 2016-09-18 ENCOUNTER — Other Ambulatory Visit: Payer: Self-pay | Admitting: Internal Medicine

## 2016-10-02 ENCOUNTER — Other Ambulatory Visit: Payer: Self-pay | Admitting: Internal Medicine

## 2016-10-02 DIAGNOSIS — Z1231 Encounter for screening mammogram for malignant neoplasm of breast: Secondary | ICD-10-CM

## 2016-11-05 ENCOUNTER — Ambulatory Visit
Admission: RE | Admit: 2016-11-05 | Discharge: 2016-11-05 | Disposition: A | Discharge status: Home / Self Care | Payer: Federal, State, Local not specified - PPO | Source: Ambulatory Visit | Attending: Internal Medicine | Admitting: Internal Medicine

## 2016-11-05 DIAGNOSIS — Z1231 Encounter for screening mammogram for malignant neoplasm of breast: Secondary | ICD-10-CM

## 2016-12-10 DIAGNOSIS — N200 Calculus of kidney: Secondary | ICD-10-CM | POA: Diagnosis not present

## 2017-03-20 DIAGNOSIS — E2839 Other primary ovarian failure: Secondary | ICD-10-CM | POA: Diagnosis not present

## 2017-03-20 DIAGNOSIS — R7303 Prediabetes: Secondary | ICD-10-CM | POA: Diagnosis not present

## 2017-03-20 DIAGNOSIS — H9012 Conductive hearing loss, unilateral, left ear, with unrestricted hearing on the contralateral side: Secondary | ICD-10-CM | POA: Diagnosis not present

## 2017-03-20 DIAGNOSIS — E785 Hyperlipidemia, unspecified: Secondary | ICD-10-CM | POA: Diagnosis not present

## 2017-03-20 DIAGNOSIS — I10 Essential (primary) hypertension: Secondary | ICD-10-CM | POA: Diagnosis not present

## 2017-03-20 DIAGNOSIS — Z1211 Encounter for screening for malignant neoplasm of colon: Secondary | ICD-10-CM | POA: Diagnosis not present

## 2017-03-20 DIAGNOSIS — Z0000 Encounter for general adult medical examination without abnormal findings: Secondary | ICD-10-CM | POA: Diagnosis not present

## 2017-03-20 DIAGNOSIS — J301 Allergic rhinitis due to pollen: Secondary | ICD-10-CM | POA: Diagnosis not present

## 2017-03-20 DIAGNOSIS — Z1389 Encounter for screening for other disorder: Secondary | ICD-10-CM | POA: Diagnosis not present

## 2017-03-20 DIAGNOSIS — Z1231 Encounter for screening mammogram for malignant neoplasm of breast: Secondary | ICD-10-CM | POA: Diagnosis not present

## 2017-04-02 DIAGNOSIS — Z1212 Encounter for screening for malignant neoplasm of rectum: Secondary | ICD-10-CM | POA: Diagnosis not present

## 2017-04-02 DIAGNOSIS — Z1211 Encounter for screening for malignant neoplasm of colon: Secondary | ICD-10-CM | POA: Diagnosis not present

## 2017-07-02 DIAGNOSIS — Z961 Presence of intraocular lens: Secondary | ICD-10-CM | POA: Diagnosis not present

## 2017-08-19 DIAGNOSIS — I10 Essential (primary) hypertension: Secondary | ICD-10-CM | POA: Diagnosis not present

## 2017-08-19 DIAGNOSIS — R7303 Prediabetes: Secondary | ICD-10-CM | POA: Diagnosis not present

## 2017-08-19 DIAGNOSIS — E785 Hyperlipidemia, unspecified: Secondary | ICD-10-CM | POA: Diagnosis not present

## 2017-10-02 ENCOUNTER — Other Ambulatory Visit: Payer: Self-pay | Admitting: Internal Medicine

## 2017-10-02 DIAGNOSIS — Z1231 Encounter for screening mammogram for malignant neoplasm of breast: Secondary | ICD-10-CM

## 2017-11-09 ENCOUNTER — Ambulatory Visit
Admission: RE | Admit: 2017-11-09 | Discharge: 2017-11-09 | Disposition: A | Discharge status: Home / Self Care | Payer: Federal, State, Local not specified - PPO | Source: Ambulatory Visit | Attending: Internal Medicine | Admitting: Internal Medicine

## 2017-11-09 DIAGNOSIS — Z1231 Encounter for screening mammogram for malignant neoplasm of breast: Secondary | ICD-10-CM

## 2018-01-21 ENCOUNTER — Ambulatory Visit (INDEPENDENT_AMBULATORY_CARE_PROVIDER_SITE_OTHER): Payer: Medicare Other | Admitting: Podiatry

## 2018-01-21 ENCOUNTER — Encounter: Payer: Self-pay | Admitting: Podiatry

## 2018-01-21 ENCOUNTER — Ambulatory Visit: Payer: Medicare Other

## 2018-01-21 ENCOUNTER — Ambulatory Visit (INDEPENDENT_AMBULATORY_CARE_PROVIDER_SITE_OTHER): Payer: Medicare Other

## 2018-01-21 VITALS — BP 153/68 | HR 58 | Resp 16

## 2018-01-21 DIAGNOSIS — M722 Plantar fascial fibromatosis: Secondary | ICD-10-CM

## 2018-01-21 DIAGNOSIS — M25571 Pain in right ankle and joints of right foot: Secondary | ICD-10-CM

## 2018-01-21 DIAGNOSIS — M779 Enthesopathy, unspecified: Secondary | ICD-10-CM

## 2018-01-21 MED ORDER — TRIAMCINOLONE ACETONIDE 10 MG/ML IJ SUSP
10.0000 mg | Freq: Once | INTRAMUSCULAR | Status: AC
  Administered 2018-01-21: 10 mg

## 2018-01-21 NOTE — Progress Notes (Signed)
   Subjective:    Patient ID: Elaine Harris, female    DOB: 07-11-1945, 72 y.o.   MRN: [id number]  HPI    Review of Systems  All other systems reviewed and are negative.      Objective:   Physical Exam        Assessment & Plan:

## 2018-01-21 NOTE — Patient Instructions (Signed)

## 2018-01-25 NOTE — Progress Notes (Signed)
Subjective:   Patient ID: Elaine Harris, female   DOB: 72 y.o.   MRN: [id number]   HPI Patient presents with exquisite discomfort plantar aspect right heel and into the right ankle with inability to walk comfortably and states that it is gotten worse over the last several months.  Does not remember specific injury and it is worse when she tries to and on the road with any type of shoe and she is tried rest and stretching.  Patient does not smoke and likes to be active   Review of Systems  All other systems reviewed and are negative.       Objective:  Physical Exam  Constitutional: She appears well-developed and well-nourished.  Cardiovascular: Intact distal pulses.  Pulmonary/Chest: Effort normal.  Musculoskeletal: Normal range of motion.  Neurological: She is alert.  Skin: Skin is warm.  Nursing note and vitals reviewed.   Neurovascular status intact muscle strength adequate range of motion within normal limits with patient noted to have exquisite discomfort in the sinus tarsi right with inflammation fluid buildup in the plantar heel right with inflammation fluid around the medial band of the fascia.  Patient was noted to have good digital perfusion well oriented x3     Assessment:  Acute sinus tarsitis right along with plantar fasciitis right     Plan:  H&P conditions reviewed and today injected the sinus tarsi right 3 mg Kenalog 5 mg Xylocaine in the plantar fascial right 3 mg Kenalog 5 mg Xylocaine and instructed on reduced activity and physical therapy.  Patient will be seen back for [place] to recheck again in the next several weeks and did have a fascial brace dispensed  X-ray indicates small plantar spur with no indication of stress fracture or advanced arthritis

## 2018-02-11 ENCOUNTER — Ambulatory Visit (INDEPENDENT_AMBULATORY_CARE_PROVIDER_SITE_OTHER): Payer: Medicare Other | Admitting: Podiatry

## 2018-02-11 ENCOUNTER — Encounter: Payer: Self-pay | Admitting: Podiatry

## 2018-02-11 DIAGNOSIS — M779 Enthesopathy, unspecified: Secondary | ICD-10-CM

## 2018-02-11 DIAGNOSIS — M722 Plantar fascial fibromatosis: Secondary | ICD-10-CM

## 2018-02-11 NOTE — Progress Notes (Signed)
Subjective:   Patient ID: Elaine Harris, female   DOB: 72 y.o.   MRN: [id number]   HPI Patient states she is improved with her heel and with her ankle but states she still has concerns about the ankle for the long-term   ROS      Objective:  Physical Exam  Neurovascular status intact with diminishment of discomfort plantar fascial right with inflammation pain of a mild nature within the right ankle with diminished range of motion of the subtalar midtarsal joint     Assessment:  Improvement fasciitis right with improvement of sinus tarsitis right with mild pain still noted     Plan:  I spent a great deal time educating her on both of these conditions and for the sinus tarsi periodic injections may be necessary along with continued supportive therapy physical therapy and not going barefoot.  Patient will be seen back as needed

## 2018-03-22 DIAGNOSIS — R7303 Prediabetes: Secondary | ICD-10-CM | POA: Diagnosis not present

## 2018-03-22 DIAGNOSIS — J301 Allergic rhinitis due to pollen: Secondary | ICD-10-CM | POA: Diagnosis not present

## 2018-03-22 DIAGNOSIS — Z1389 Encounter for screening for other disorder: Secondary | ICD-10-CM | POA: Diagnosis not present

## 2018-03-22 DIAGNOSIS — Z23 Encounter for immunization: Secondary | ICD-10-CM | POA: Diagnosis not present

## 2018-03-22 DIAGNOSIS — M25552 Pain in left hip: Secondary | ICD-10-CM | POA: Diagnosis not present

## 2018-03-22 DIAGNOSIS — I10 Essential (primary) hypertension: Secondary | ICD-10-CM | POA: Diagnosis not present

## 2018-03-22 DIAGNOSIS — H9012 Conductive hearing loss, unilateral, left ear, with unrestricted hearing on the contralateral side: Secondary | ICD-10-CM | POA: Diagnosis not present

## 2018-03-22 DIAGNOSIS — Z0000 Encounter for general adult medical examination without abnormal findings: Secondary | ICD-10-CM | POA: Diagnosis not present

## 2018-03-22 DIAGNOSIS — E785 Hyperlipidemia, unspecified: Secondary | ICD-10-CM | POA: Diagnosis not present

## 2018-03-22 DIAGNOSIS — Z6835 Body mass index (BMI) 35.0-35.9, adult: Secondary | ICD-10-CM | POA: Diagnosis not present

## 2018-07-05 DIAGNOSIS — Z961 Presence of intraocular lens: Secondary | ICD-10-CM | POA: Diagnosis not present

## 2019-01-27 DIAGNOSIS — Z23 Encounter for immunization: Secondary | ICD-10-CM | POA: Diagnosis not present

## 2019-04-01 ENCOUNTER — Other Ambulatory Visit: Payer: Self-pay | Admitting: Internal Medicine

## 2019-04-01 DIAGNOSIS — R7303 Prediabetes: Secondary | ICD-10-CM | POA: Diagnosis not present

## 2019-04-01 DIAGNOSIS — E785 Hyperlipidemia, unspecified: Secondary | ICD-10-CM | POA: Diagnosis not present

## 2019-04-01 DIAGNOSIS — Z1231 Encounter for screening mammogram for malignant neoplasm of breast: Secondary | ICD-10-CM

## 2019-04-01 DIAGNOSIS — I10 Essential (primary) hypertension: Secondary | ICD-10-CM | POA: Diagnosis not present

## 2019-04-01 DIAGNOSIS — Z1389 Encounter for screening for other disorder: Secondary | ICD-10-CM | POA: Diagnosis not present

## 2019-04-01 DIAGNOSIS — Z6835 Body mass index (BMI) 35.0-35.9, adult: Secondary | ICD-10-CM | POA: Diagnosis not present

## 2019-04-01 DIAGNOSIS — M85859 Other specified disorders of bone density and structure, unspecified thigh: Secondary | ICD-10-CM | POA: Diagnosis not present

## 2019-04-01 DIAGNOSIS — Z0000 Encounter for general adult medical examination without abnormal findings: Secondary | ICD-10-CM | POA: Diagnosis not present

## 2019-04-01 DIAGNOSIS — J301 Allergic rhinitis due to pollen: Secondary | ICD-10-CM | POA: Diagnosis not present

## 2019-05-16 DIAGNOSIS — N3000 Acute cystitis without hematuria: Secondary | ICD-10-CM | POA: Diagnosis not present

## 2019-06-08 ENCOUNTER — Ambulatory Visit: Payer: Federal, State, Local not specified - PPO | Attending: Internal Medicine

## 2019-06-08 DIAGNOSIS — Z23 Encounter for immunization: Secondary | ICD-10-CM

## 2019-06-08 NOTE — Progress Notes (Signed)
   Covid-19 Vaccination Clinic  Name:  KAYDIN VASSAR    MRN: [id number] DOB: Jun 26, 1945  06/08/2019  Ms. Jasek was observed post Covid-19 immunization for 15 minutes without incidence. She was provided with Vaccine Information Sheet and instruction to access the V-Safe system.   Ms. Habicht was instructed to call 911 with any severe reactions post vaccine: Marland Kitchen Difficulty breathing  . Swelling of your face and throat  . A fast heartbeat  . A bad rash all over your body  . Dizziness and weakness    Immunizations Administered    Name Date Dose VIS Date Route   Pfizer COVID-19 Vaccine 06/08/2019  4:30 PM 0.3 mL 04/29/2019 Intramuscular   Manufacturer: [place]   Lot: [id number]   [place]: [id number]

## 2019-06-24 ENCOUNTER — Other Ambulatory Visit: Payer: Self-pay

## 2019-06-24 ENCOUNTER — Ambulatory Visit
Admission: RE | Admit: 2019-06-24 | Discharge: 2019-06-24 | Disposition: A | Discharge status: Home / Self Care | Payer: Federal, State, Local not specified - PPO | Source: Ambulatory Visit | Attending: Internal Medicine | Admitting: Internal Medicine

## 2019-06-24 DIAGNOSIS — Z1231 Encounter for screening mammogram for malignant neoplasm of breast: Secondary | ICD-10-CM

## 2019-06-24 DIAGNOSIS — M85859 Other specified disorders of bone density and structure, unspecified thigh: Secondary | ICD-10-CM

## 2019-06-24 DIAGNOSIS — Z780 Asymptomatic menopausal state: Secondary | ICD-10-CM | POA: Diagnosis not present

## 2019-06-24 DIAGNOSIS — Z13820 Encounter for screening for osteoporosis: Secondary | ICD-10-CM | POA: Diagnosis not present

## 2019-06-26 ENCOUNTER — Ambulatory Visit: Payer: Federal, State, Local not specified - PPO | Attending: Internal Medicine

## 2019-06-26 DIAGNOSIS — Z23 Encounter for immunization: Secondary | ICD-10-CM

## 2019-06-26 NOTE — Progress Notes (Signed)
   Covid-19 Vaccination Clinic  Name:  Elaine Harris    MRN: [id number] DOB: 13-Jan-1946  06/26/2019  Elaine Harris was observed post Covid-19 immunization for 15 minutes without incidence. She was provided with Vaccine Information Sheet and instruction to access the V-Safe system.   Elaine Harris was instructed to call 911 with any severe reactions post vaccine: Marland Kitchen Difficulty breathing  . Swelling of your face and throat  . A fast heartbeat  . A bad rash all over your body  . Dizziness and weakness    Immunizations Administered    Name Date Dose VIS Date Route   Pfizer COVID-19 Vaccine 06/26/2019  8:41 AM 0.3 mL 04/29/2019 Intramuscular   Manufacturer: [place]   Lot: [id number]   [place]: [id number]

## 2019-07-11 DIAGNOSIS — Z961 Presence of intraocular lens: Secondary | ICD-10-CM | POA: Diagnosis not present

## 2019-08-15 DIAGNOSIS — N3000 Acute cystitis without hematuria: Secondary | ICD-10-CM | POA: Diagnosis not present

## 2019-08-15 DIAGNOSIS — N3941 Urge incontinence: Secondary | ICD-10-CM | POA: Diagnosis not present

## 2019-09-26 DIAGNOSIS — N200 Calculus of kidney: Secondary | ICD-10-CM | POA: Diagnosis not present

## 2019-09-26 DIAGNOSIS — N3941 Urge incontinence: Secondary | ICD-10-CM | POA: Diagnosis not present

## 2019-09-26 DIAGNOSIS — N3020 Other chronic cystitis without hematuria: Secondary | ICD-10-CM | POA: Diagnosis not present

## 2019-11-10 DIAGNOSIS — N200 Calculus of kidney: Secondary | ICD-10-CM | POA: Diagnosis not present

## 2019-11-18 ENCOUNTER — Other Ambulatory Visit: Payer: Self-pay | Admitting: Urology

## 2019-11-18 DIAGNOSIS — D49512 Neoplasm of unspecified behavior of left kidney: Secondary | ICD-10-CM

## 2019-12-05 ENCOUNTER — Ambulatory Visit (HOSPITAL_COMMUNITY)
Admission: RE | Admit: 2019-12-05 | Discharge: 2019-12-05 | Disposition: A | Discharge status: Home / Self Care | Payer: Medicare Other | Source: Ambulatory Visit | Attending: Urology | Admitting: Urology

## 2019-12-05 DIAGNOSIS — C642 Malignant neoplasm of left kidney, except renal pelvis: Secondary | ICD-10-CM | POA: Diagnosis not present

## 2019-12-05 DIAGNOSIS — N281 Cyst of kidney, acquired: Secondary | ICD-10-CM | POA: Diagnosis not present

## 2019-12-05 DIAGNOSIS — D49512 Neoplasm of unspecified behavior of left kidney: Secondary | ICD-10-CM | POA: Insufficient documentation

## 2019-12-05 DIAGNOSIS — D1779 Benign lipomatous neoplasm of other sites: Secondary | ICD-10-CM | POA: Diagnosis not present

## 2019-12-05 DIAGNOSIS — N200 Calculus of kidney: Secondary | ICD-10-CM | POA: Diagnosis not present

## 2019-12-05 MED ORDER — GADOBUTROL 1 MMOL/ML IV SOLN
8.0000 mL | Freq: Once | INTRAVENOUS | Status: AC | PRN
  Administered 2019-12-05: 8 mL via INTRAVENOUS

## 2019-12-08 ENCOUNTER — Other Ambulatory Visit: Payer: Self-pay | Admitting: Urology

## 2019-12-16 ENCOUNTER — Other Ambulatory Visit (HOSPITAL_COMMUNITY)
Admission: RE | Admit: 2019-12-16 | Discharge: 2019-12-16 | Disposition: A | Discharge status: Home / Self Care | Payer: Medicare Other | Source: Ambulatory Visit | Attending: Urology | Admitting: Urology

## 2019-12-16 DIAGNOSIS — Z01812 Encounter for preprocedural laboratory examination: Secondary | ICD-10-CM | POA: Insufficient documentation

## 2019-12-16 DIAGNOSIS — Z20822 Contact with and (suspected) exposure to covid-19: Secondary | ICD-10-CM | POA: Insufficient documentation

## 2019-12-16 LAB — SARS CORONAVIRUS 2 (TAT 6-24 HRS): SARS Coronavirus 2: NEGATIVE

## 2019-12-16 NOTE — Patient Instructions (Signed)
DUE TO COVID-19 ONLY ONE VISITOR IS ALLOWED TO COME WITH YOU AND STAY IN THE WAITING ROOM ONLY DURING PRE OP AND PROCEDURE DAY OF SURGERY. THE 2 VISITORS MAY VISIT WITH YOU AFTER SURGERY IN YOUR PRIVATE ROOM DURING VISITING HOURS ONLY!  YOU NEED TO HAVE A COVID 19 TEST ON: 12/16/19, THIS TEST MUST BE DONE BEFORE SURGERY, COME  [place], [place] , [id number].  [facility]) , COVID TESTING SITE [place] [id number], IT IS ON THE RIGHT GOING OUT [address] APPROX[address] 2 MINUTES PAST [employer] ON THE RIGHT. ONCE YOUR COVID TEST IS COMPLETED,  PLEASE BEGIN THE QUARANTINE INSTRUCTIONS AS OUTLINED IN YOUR HANDOUT.                [place]    Your procedure is scheduled on: 12/20/19   Report to [facility] Main  Entrance   Report to admitting at: 8:45 AM     Call this number if you have problems the morning of surgery [phone]    Remember: Do not eat solid food :After Midnight. Clear liquids from midnight until: 7:45 am.  CLEAR LIQUID DIET   Foods Allowed                                                                     Foods Excluded  Coffee and tea, regular and decaf                             liquids that you cannot  Plain Jell-O any favor except red or purple                                           see through such as: Fruit ices (not with fruit pulp)                                     milk, soups, orange juice  Iced Popsicles                                    All solid food Carbonated beverages, regular and diet                                    Cranberry, grape and apple juices Sports drinks like Gatorade Lightly seasoned clear broth or consume(fat free) Sugar, honey syrup  Sample Menu Breakfast                                Lunch                                     Supper Cranberry juice  Beef broth                            Chicken broth Jell-O                                      Grape juice                           Apple juice Coffee or tea                        Jell-O                                      Popsicle                                                Coffee or tea                        Coffee or tea  _____________________________________________________________________  BRUSH YOUR TEETH MORNING OF SURGERY AND RINSE YOUR MOUTH OUT, NO CHEWING GUM CANDY OR MINTS.                                 You may not have any metal on your body including hair pins and              piercings  Do not wear jewelry, make-up, lotions, powders or perfumes, deodorant             Do not wear nail polish on your fingernails.  Do not shave  48 hours prior to surgery.          Do not bring valuables to the hospital. [place].  Contacts, dentures or bridgework may not be worn into surgery.  Leave suitcase in the car. After surgery it may be brought to your room.     Patients discharged the day of surgery will not be allowed to drive home. IF YOU ARE HAVING SURGERY AND GOING HOME THE SAME DAY, YOU MUST HAVE AN ADULT TO DRIVE YOU HOME AND BE WITH YOU FOR 24 HOURS. YOU MAY GO HOME BY TAXI OR UBER OR ORTHERWISE, BUT AN ADULT MUST ACCOMPANY YOU HOME AND STAY WITH YOU FOR 24 HOURS.  Name and phone number of your driver:  Special Instructions: N/A              Please read over the following fact sheets you were given: _____________________________________________________________________         [facility] - Preparing for Surgery Before surgery, you can play an important role.  Because skin is not sterile, your skin needs to be as free of germs as possible.  You can reduce the number of germs on your skin by washing with CHG (chlorahexidine gluconate) soap before surgery.  CHG is an antiseptic cleaner which kills germs and bonds with the skin  to continue killing germs even after washing. Please DO NOT use if you have  an allergy to CHG or antibacterial soaps.  If your skin becomes reddened/irritated stop using the CHG and inform your nurse when you arrive at Short Stay. Do not shave (including legs and underarms) for at least 48 hours prior to the first CHG shower.  You may shave your face/neck. Please follow these instructions carefully:  1.  Shower with CHG Soap the night before surgery and the  morning of Surgery.  2.  If you choose to wash your hair, wash your hair first as usual with your  normal  shampoo.  3.  After you shampoo, rinse your hair and body thoroughly to remove the  shampoo.                           4.  Use CHG as you would any other liquid soap.  You can apply chg directly  to the skin and wash                       Gently with a scrungie or clean washcloth.  5.  Apply the CHG Soap to your body ONLY FROM THE NECK DOWN.   Do not use on face/ open                           Wound or open sores. Avoid contact with eyes, ears mouth and genitals (private parts).                       Wash face,  Genitals (private parts) with your normal soap.             6.  Wash thoroughly, paying special attention to the area where your surgery  will be performed.  7.  Thoroughly rinse your body with warm water from the neck down.  8.  DO NOT shower/wash with your normal soap after using and rinsing off  the CHG Soap.                9.  Pat yourself dry with a clean towel.            10.  Wear clean pajamas.            11.  Place clean sheets on your bed the night of your first shower and do not  sleep with pets. Day of Surgery : Do not apply any lotions/deodorants the morning of surgery.  Please wear clean clothes to the hospital/surgery center.  FAILURE TO FOLLOW THESE INSTRUCTIONS MAY RESULT IN THE CANCELLATION OF YOUR SURGERY PATIENT SIGNATURE_________________________________  NURSE SIGNATURE__________________________________  ________________________________________________________________________

## 2019-12-18 DIAGNOSIS — I444 Left anterior fascicular block: Secondary | ICD-10-CM

## 2019-12-18 HISTORY — DX: Left anterior fascicular block: I44.4

## 2019-12-19 ENCOUNTER — Encounter (HOSPITAL_COMMUNITY)
Admission: RE | Admit: 2019-12-19 | Discharge: 2019-12-19 | Disposition: A | Discharge status: Home / Self Care | Payer: Medicare Other | Source: Ambulatory Visit | Attending: Urology | Admitting: Urology

## 2019-12-19 ENCOUNTER — Encounter (HOSPITAL_COMMUNITY): Payer: Self-pay

## 2019-12-19 ENCOUNTER — Other Ambulatory Visit: Payer: Self-pay

## 2019-12-19 DIAGNOSIS — Z01810 Encounter for preprocedural cardiovascular examination: Secondary | ICD-10-CM | POA: Insufficient documentation

## 2019-12-19 DIAGNOSIS — R9431 Abnormal electrocardiogram [ECG] [EKG]: Secondary | ICD-10-CM | POA: Diagnosis not present

## 2019-12-19 DIAGNOSIS — Z01812 Encounter for preprocedural laboratory examination: Secondary | ICD-10-CM | POA: Diagnosis not present

## 2019-12-19 HISTORY — DX: Pneumonia, unspecified organism: J18.9

## 2019-12-19 HISTORY — DX: Prediabetes: R73.03

## 2019-12-19 HISTORY — DX: Malignant (primary) neoplasm, unspecified: C80.1

## 2019-12-19 LAB — BASIC METABOLIC PANEL
Anion gap: 10 (ref 5–15)
BUN: 12 mg/dL (ref 8–23)
CO2: 26 mmol/L (ref 22–32)
Calcium: 9.4 mg/dL (ref 8.9–10.3)
Chloride: 104 mmol/L (ref 98–111)
Creatinine, Ser: 0.74 mg/dL (ref 0.44–1.00)
GFR calc Af Amer: >60 mL/min (ref >60)
GFR calc non Af Amer: >60 mL/min (ref >60)
Glucose, Bld: 115 mg/dL — ABNORMAL HIGH (ref 70–99)
Potassium: 4.3 mmol/L (ref 3.5–5.1)
Sodium: 140 mmol/L (ref 135–145)

## 2019-12-19 LAB — CBC
HCT: 46.2 % — ABNORMAL HIGH (ref 36.0–46.0)
Hemoglobin: 14.7 g/dL (ref 12.0–15.0)
MCH: 28.9 pg (ref 26.0–34.0)
MCHC: 31.8 g/dL (ref 30.0–36.0)
MCV: 90.9 fL (ref 80.0–100.0)
Platelets: 221 10*3/uL (ref 150–400)
RBC: 5.08 MIL/uL (ref 3.87–5.11)
RDW: 14.8 % (ref 11.5–15.5)
WBC: 7.9 10*3/uL (ref 4.0–10.5)
nRBC: 0 % (ref 0.0–0.2)

## 2019-12-19 LAB — HEMOGLOBIN A1C
Hgb A1c MFr Bld: 5.9 % — ABNORMAL HIGH (ref 4.8–5.6)
Mean Plasma Glucose: 122.63 mg/dL

## 2019-12-19 NOTE — Progress Notes (Signed)
COVID Vaccine Completed:yes Date COVID Vaccine completed:06/26/19 COVID vaccine manufacturer:* Pfizer    Moderna   [employer]   PCP - Dr. Leeroy Cha Cardiologist - No  Chest x-ray -  EKG -  Stress Test -  ECHO -  Cardiac Cath -   Sleep Study -  CPAP -   Fasting Blood Sugar -  Checks Blood Sugar _____ times a day  Blood Thinner Instructions: Aspirin Instructions: Last Dose:  Anesthesia review: BP was elevated during PST visit,195/74.Recheck: 192/105. Anesthesia PA was notified.Pt. has Hx. Of hypertension,she did not take her lisinopril today.  Patient denies shortness of breath, fever, cough and chest pain at PAT appointment   Patient verbalized understanding of instructions that were given to them at the PAT appointment. Patient was also instructed that they will need to review over the PAT instructions again at home before surgery.

## 2019-12-20 ENCOUNTER — Other Ambulatory Visit: Payer: Self-pay

## 2019-12-20 ENCOUNTER — Ambulatory Visit (HOSPITAL_COMMUNITY)
Admission: RE | Admit: 2019-12-20 | Discharge: 2019-12-20 | Disposition: A | Discharge status: Home / Self Care | Payer: Medicare Other | Attending: Urology | Admitting: Urology

## 2019-12-20 ENCOUNTER — Ambulatory Visit (HOSPITAL_COMMUNITY): Payer: Medicare Other | Admitting: Physician Assistant

## 2019-12-20 ENCOUNTER — Encounter (HOSPITAL_COMMUNITY)
Admission: RE | Disposition: A | Discharge status: Home / Self Care | Payer: Self-pay | Source: Home / Self Care | Attending: Urology

## 2019-12-20 ENCOUNTER — Other Ambulatory Visit: Payer: Self-pay | Admitting: Urology

## 2019-12-20 ENCOUNTER — Ambulatory Visit (HOSPITAL_COMMUNITY): Payer: Medicare Other

## 2019-12-20 ENCOUNTER — Encounter (HOSPITAL_COMMUNITY): Payer: Self-pay | Admitting: Urology

## 2019-12-20 DIAGNOSIS — N200 Calculus of kidney: Secondary | ICD-10-CM | POA: Insufficient documentation

## 2019-12-20 DIAGNOSIS — I10 Essential (primary) hypertension: Secondary | ICD-10-CM | POA: Diagnosis not present

## 2019-12-20 DIAGNOSIS — I129 Hypertensive chronic kidney disease with stage 1 through stage 4 chronic kidney disease, or unspecified chronic kidney disease: Secondary | ICD-10-CM | POA: Insufficient documentation

## 2019-12-20 DIAGNOSIS — Z87442 Personal history of urinary calculi: Secondary | ICD-10-CM | POA: Diagnosis not present

## 2019-12-20 DIAGNOSIS — N189 Chronic kidney disease, unspecified: Secondary | ICD-10-CM | POA: Insufficient documentation

## 2019-12-20 DIAGNOSIS — R7303 Prediabetes: Secondary | ICD-10-CM | POA: Diagnosis not present

## 2019-12-20 DIAGNOSIS — N2889 Other specified disorders of kidney and ureter: Secondary | ICD-10-CM | POA: Insufficient documentation

## 2019-12-20 HISTORY — PX: CYSTOSCOPY/URETEROSCOPY/HOLMIUM LASER/STENT PLACEMENT: SHX6546

## 2019-12-20 SURGERY — CYSTOSCOPY/URETEROSCOPY/HOLMIUM LASER/STENT PLACEMENT
Anesthesia: General | Laterality: Left

## 2019-12-20 MED ORDER — PROPOFOL 10 MG/ML IV BOLUS
INTRAVENOUS | Status: DC | PRN
  Administered 2019-12-20: 200 mg via INTRAVENOUS

## 2019-12-20 MED ORDER — DEXAMETHASONE SODIUM PHOSPHATE 10 MG/ML IJ SOLN
INTRAMUSCULAR | Status: DC | PRN
  Administered 2019-12-20: 4 mg via INTRAVENOUS

## 2019-12-20 MED ORDER — ONDANSETRON HCL 4 MG PO TABS: 4.0000 mg | ORAL_TABLET | Freq: Every day | ORAL | 1 refills | Status: AC | PRN

## 2019-12-20 MED ORDER — LIDOCAINE 2% (20 MG/ML) 5 ML SYRINGE
INTRAMUSCULAR | Status: AC
  Filled 2019-12-20: qty 5

## 2019-12-20 MED ORDER — KETOROLAC TROMETHAMINE 30 MG/ML IJ SOLN: 30.0000 mg | Freq: Once | INTRAMUSCULAR | Status: DC | PRN

## 2019-12-20 MED ORDER — HYDROCODONE-ACETAMINOPHEN 5-325 MG PO TABS: 1.0000 | ORAL_TABLET | ORAL | 0 refills | Status: DC | PRN

## 2019-12-20 MED ORDER — FENTANYL CITRATE (PF) 100 MCG/2ML IJ SOLN: 25.0000 ug | INTRAMUSCULAR | Status: DC | PRN

## 2019-12-20 MED ORDER — DEXAMETHASONE SODIUM PHOSPHATE 10 MG/ML IJ SOLN
INTRAMUSCULAR | Status: AC
  Filled 2019-12-20: qty 1

## 2019-12-20 MED ORDER — OXYCODONE HCL 5 MG PO TABS
ORAL_TABLET | ORAL | Status: AC
  Administered 2019-12-20: 10 mg via ORAL
  Filled 2019-12-20: qty 2

## 2019-12-20 MED ORDER — TAMSULOSIN HCL 0.4 MG PO CAPS
0.4000 mg | ORAL_CAPSULE | Freq: Every day | ORAL | 0 refills | Status: DC
Start: 2019-12-20 — End: 2020-02-19

## 2019-12-20 MED ORDER — SODIUM CHLORIDE 0.9 % IR SOLN
Status: DC | PRN
  Administered 2019-12-20: 6000 mL

## 2019-12-20 MED ORDER — ORAL CARE MOUTH RINSE: 15.0000 mL | Freq: Once | OROMUCOSAL | Status: AC

## 2019-12-20 MED ORDER — ONDANSETRON HCL 4 MG/2ML IJ SOLN
INTRAMUSCULAR | Status: AC
  Filled 2019-12-20: qty 2

## 2019-12-20 MED ORDER — CEFAZOLIN SODIUM-DEXTROSE 2-4 GM/100ML-% IV SOLN
2.0000 g | Freq: Once | INTRAVENOUS | Status: AC
  Administered 2019-12-20: 2 g via INTRAVENOUS
  Filled 2019-12-20: qty 100

## 2019-12-20 MED ORDER — FENTANYL CITRATE (PF) 100 MCG/2ML IJ SOLN
INTRAMUSCULAR | Status: AC
  Filled 2019-12-20: qty 2

## 2019-12-20 MED ORDER — OXYCODONE HCL 5 MG PO TABS: 10.0000 mg | ORAL_TABLET | Freq: Once | ORAL | Status: AC

## 2019-12-20 MED ORDER — PROPOFOL 10 MG/ML IV BOLUS
INTRAVENOUS | Status: AC
  Filled 2019-12-20: qty 20

## 2019-12-20 MED ORDER — FENTANYL CITRATE (PF) 100 MCG/2ML IJ SOLN
INTRAMUSCULAR | Status: DC | PRN
  Administered 2019-12-20 (×2): 50 ug via INTRAVENOUS

## 2019-12-20 MED ORDER — PROMETHAZINE HCL 25 MG/ML IJ SOLN: 6.2500 mg | INTRAMUSCULAR | Status: DC | PRN

## 2019-12-20 MED ORDER — LIDOCAINE 2% (20 MG/ML) 5 ML SYRINGE
INTRAMUSCULAR | Status: DC | PRN
  Administered 2019-12-20: 40 mg via INTRAVENOUS

## 2019-12-20 MED ORDER — CHLORHEXIDINE GLUCONATE 0.12 % MT SOLN
15.0000 mL | Freq: Once | OROMUCOSAL | Status: AC
  Administered 2019-12-20: 15 mL via OROMUCOSAL

## 2019-12-20 MED ORDER — ONDANSETRON HCL 4 MG/2ML IJ SOLN
INTRAMUSCULAR | Status: DC | PRN
  Administered 2019-12-20: 4 mg via INTRAVENOUS

## 2019-12-20 MED ORDER — PHENAZOPYRIDINE HCL 200 MG PO TABS
200.0000 mg | ORAL_TABLET | Freq: Three times a day (TID) | ORAL | 0 refills | Status: AC | PRN
Start: 2019-12-20 — End: 2020-12-19

## 2019-12-20 MED ORDER — IOHEXOL 300 MG/ML  SOLN
INTRAMUSCULAR | Status: DC | PRN
  Administered 2019-12-20: 10 mL via URETHRAL

## 2019-12-20 MED ORDER — LACTATED RINGERS IV SOLN: INTRAVENOUS | Status: DC

## 2019-12-20 SURGICAL SUPPLY — 21 items
BAG URO CATCHER STRL LF (MISCELLANEOUS) ×2 IMPLANT
BASKET ZERO TIP NITINOL 2.4FR (BASKET) IMPLANT
BSKT STON RTRVL ZERO TP 2.4FR (BASKET)
CATH URET 5FR 28IN OPEN ENDED (CATHETERS) ×2 IMPLANT
CLOTH BEACON ORANGE TIMEOUT ST (SAFETY) ×2 IMPLANT
EXTRACTOR STONE NITINOL NGAGE (UROLOGICAL SUPPLIES) ×1 IMPLANT
GLOVE BIOGEL M STRL SZ7.5 (GLOVE) ×2 IMPLANT
GOWN STRL REUS W/TWL XL LVL3 (GOWN DISPOSABLE) ×2 IMPLANT
GUIDEWIRE STR DUAL SENSOR (WIRE) ×1 IMPLANT
GUIDEWIRE ZIPWRE .038 STRAIGHT (WIRE) ×2 IMPLANT
KIT TURNOVER KIT A (KITS) IMPLANT
LASER FIB FLEXIVA PULSE ID 365 (Laser) ×1 IMPLANT
MANIFOLD NEPTUNE II (INSTRUMENTS) ×2 IMPLANT
PACK CYSTO (CUSTOM PROCEDURE TRAY) ×2 IMPLANT
SHEATH URETERAL 12FRX35CM (MISCELLANEOUS) ×1 IMPLANT
STENT URET 6FRX24 CONTOUR (STENTS) ×1 IMPLANT
STENT URET 6FRX26 CONTOUR (STENTS) IMPLANT
TRACTIP FLEXIVA PULS ID 200XHI (Laser) IMPLANT
TRACTIP FLEXIVA PULSE ID 200 (Laser)
TUBING CONNECTING 10 (TUBING) ×2 IMPLANT
TUBING UROLOGY SET (TUBING) ×2 IMPLANT

## 2019-12-20 NOTE — Anesthesia Procedure Notes (Signed)
Procedure Name: LMA Insertion Date/Time: 12/20/2019 11:03 AM Performed by: Niel Hummer, CRNA Pre-anesthesia Checklist: Patient identified, Emergency Drugs available, Patient being monitored and Suction available Patient Re-evaluated:Patient Re-evaluated prior to induction Oxygen Delivery Method: Circle system utilized Preoxygenation: Pre-oxygenation with 100% oxygen Induction Type: IV induction Ventilation: Mask ventilation without difficulty LMA: LMA inserted LMA Size: 4.0 Number of attempts: 1 Dental Injury: Teeth and Oropharynx as per pre-operative assessment

## 2019-12-20 NOTE — Anesthesia Preprocedure Evaluation (Signed)
Anesthesia Evaluation  Patient identified by MRN, date of birth, ID band Patient awake    Reviewed: Allergy & Precautions, NPO status , Patient's Chart, lab work & pertinent test results  History of Anesthesia Complications (+) PONV  Airway Mallampati: II  TM Distance: >3 FB Neck ROM: Full    Dental no notable dental hx.    Pulmonary neg pulmonary ROS,    Pulmonary exam normal breath sounds clear to auscultation       Cardiovascular hypertension, Normal cardiovascular exam Rhythm:Regular Rate:Normal     Neuro/Psych negative neurological ROS  negative psych ROS   GI/Hepatic negative GI ROS, Neg liver ROS,   Endo/Other  negative endocrine ROS  Renal/GU negative Renal ROS  negative genitourinary   Musculoskeletal negative musculoskeletal ROS (+)   Abdominal   Peds negative pediatric ROS (+)  Hematology negative hematology ROS (+)   Anesthesia Other Findings   Reproductive/Obstetrics negative OB ROS                             Anesthesia Physical Anesthesia Plan  ASA: II  Anesthesia Plan: General   Post-op Pain Management:    Induction: Intravenous  PONV Risk Score and Plan: 4 or greater and Ondansetron, Dexamethasone and Treatment may vary due to age or medical condition  Airway Management Planned: LMA  Additional Equipment:   Intra-op Plan:   Post-operative Plan: Extubation in OR  Informed Consent: I have reviewed the patients History and Physical, chart, labs and discussed the procedure including the risks, benefits and alternatives for the proposed anesthesia with the patient or authorized representative who has indicated his/her understanding and acceptance.     Dental advisory given  Plan Discussed with: CRNA and Surgeon  Anesthesia Plan Comments:         Anesthesia Quick Evaluation  

## 2019-12-20 NOTE — Anesthesia Postprocedure Evaluation (Signed)
Anesthesia Post Note  Patient: Elaine Harris  Procedure(s) Performed: CYSTOSCOPY/RETROGRADE/URETEROSCOPY/HOLMIUM LASER/STENT PLACEMENT (Left )     Patient location during evaluation: PACU Anesthesia Type: General Level of consciousness: awake and alert Pain management: pain level controlled Vital Signs Assessment: post-procedure vital signs reviewed and stable Respiratory status: spontaneous breathing, nonlabored ventilation, respiratory function stable and patient connected to nasal cannula oxygen Cardiovascular status: blood pressure returned to baseline and stable Postop Assessment: no apparent nausea or vomiting Anesthetic complications: no   No complications documented.  Last Vitals:  Vitals:   12/20/19 1451 12/20/19 1541  BP: (!) 181/62   Pulse: (!) 52   Resp: 18 16  Temp:  36.9 C  SpO2: 96% 95%    Last Pain:  Vitals:   12/20/19 1541  TempSrc:   PainSc: 6                  [name]

## 2019-12-20 NOTE — H&P (Signed)
Urology Preoperative H&P   Chief Complaint: left renal stones  History of Present Illness: Elaine Harris is a 74 y.o. female with multiple left renal stones along with a solid and enhancing left renal mass concerning for RCC.  She is here today for ureteroscopy to address her left renal stone burden prior to proceeding with robotic left partial nephrectomy.  She denies interval episodes of left-sided flank pain, nausea/vomiting, fever/chills, dysuria or hematuria.    Past Medical History:  Diagnosis Date  . Cancer ([facility])    kidney  . Chronic kidney disease    kidney stone several times  . Hypertension   . Pneumonia   . PONV (postoperative nausea and vomiting)    after hysterectomy  . Pre-diabetes     Past Surgical History:  Procedure Laterality Date  . ABDOMINAL HYSTERECTOMY     1984  . BREAST SURGERY     1986-breast biopsy  . EYE SURGERY     cataracts  . FOOT SURGERY  2014   rt foot-d/t athritis , put pin in it, still in place.  Marland Kitchen [place]  . LITHOTRIPSY  2011  . ROTATOR CUFF REPAIR  2008    Allergies:  Allergies  Allergen Reactions  . Other     Pollen-runny nose and cough    History reviewed. No pertinent family history.  Social History:  reports that she has never smoked. She has never used smokeless tobacco. She reports that she does not drink alcohol and does not use drugs.  ROS: A complete review of systems was performed.  All systems are negative except for pertinent findings as noted.  Physical Exam:  Vital signs in last 24 hours: Temp:  [98.4 F (36.9 C)] 98.4 F (36.9 C) (08/03 0907) Pulse Rate:  [84] 84 (08/03 0907) Resp:  [16] 16 (08/03 0907) BP: (160-190)/(73-88) 160/73 (08/03 1003) SpO2:  [94 %] 94 % (08/03 0907) Weight:  [88.5 kg] 88.5 kg (08/03 0927) Constitutional:  Alert and oriented, No acute distress Cardiovascular: Regular rate and rhythm, No JVD Respiratory: Normal respiratory effort, Lungs clear bilaterally GI:  Abdomen is soft, nontender, nondistended, no abdominal masses GU: No CVA tenderness Lymphatic: No lymphadenopathy Neurologic: Grossly intact, no focal deficits Psychiatric: Normal mood and affect  Laboratory Data:  Recent Labs    12/19/19 0938  WBC 7.9  HGB 14.7  HCT 46.2*  PLT 221    Recent Labs    12/19/19 0938  NA 140  K 4.3  CL 104  GLUCOSE 115*  BUN 12  CALCIUM 9.4  CREATININE 0.74     No results found for this or any previous visit (from the past 24 hour(s)). Recent Results (from the past 240 hour(s))  SARS CORONAVIRUS 2 (TAT 6-24 HRS) Nasopharyngeal Nasopharyngeal Swab     Status: None   Collection Time: 12/16/19 10:13 AM   Specimen: Nasopharyngeal Swab  Result Value Ref Range Status   SARS Coronavirus 2 NEGATIVE NEGATIVE Final    Comment: (NOTE) SARS-CoV-2 target nucleic acids are NOT DETECTED.  The SARS-CoV-2 RNA is generally detectable in upper and lower respiratory specimens during the acute phase of infection. Negative results do not preclude SARS-CoV-2 infection, do not rule out co-infections with other pathogens, and should not be used as the sole basis for treatment or other patient management decisions. Negative results must be combined with clinical observations, patient history, and epidemiological information. The expected result is Negative.  Fact Sheet for Patients: SugarRoll.be  Fact Sheet for Healthcare  Providers: https://www.woods-mathews.com/  This test is not yet approved or cleared by the [place] and  has been authorized for detection and/or diagnosis of SARS-CoV-2 by FDA under an Emergency Use Authorization (EUA). This EUA will remain  in effect (meaning this test can be used) for the duration of the COVID-19 declaration under Se ction 564(b)(1) of the Act, 21 U.S.C. section 360bbb-3(b)(1), unless the authorization is terminated or revoked sooner.  Performed at [facility], [place] [address], [place], [place] [address]     Renal Function: Recent Labs    12/19/19 4383  CREATININE 0.74   Estimated Creatinine Clearance: 66.4 mL/min (by C-G formula based on SCr of 0.74 mg/dL).  Radiologic Imaging: No results found.  I independently reviewed the above imaging studies.  Assessment and Plan [name] is a 74 y.o. female with multiple left renal stones  The risks, benefits and alternatives of cystoscopy with LEFT ureteroscopy, laser lithotripsy and ureteral stent placement was discussed the patient.  Risks included, but are not limited to: bleeding, urinary tract infection, ureteral injury/avulsion, ureteral stricture formation, retained stone fragments, the possibility that multiple surgeries may be required to treat the stone(s), MI, stroke, PE and the inherent risks of general anesthesia.  The patient voices understanding and wishes to proceed.     Ellison Hughs, MD 12/20/2019, 10:52 AM  [facility] Pager: [phone]

## 2019-12-20 NOTE — Transfer of Care (Signed)
Immediate Anesthesia Transfer of Care Note  Patient: Elaine Harris  Procedure(s) Performed: CYSTOSCOPY/RETROGRADE/URETEROSCOPY/HOLMIUM LASER/STENT PLACEMENT (Left )  Patient Location: PACU  Anesthesia Type:General  Level of Consciousness: awake, alert  and oriented  Airway & Oxygen Therapy: Patient Spontanous Breathing and Patient connected to face mask oxygen  Post-op Assessment: Report given to RN, Post -op Vital signs reviewed and stable and Patient moving all extremities X 4  Post vital signs: Reviewed and stable  Last Vitals:  Vitals Value Taken Time  BP    Temp    Pulse 60 12/20/19 1302  Resp 11 12/20/19 1302  SpO2 100 % 12/20/19 1302  Vitals shown include unvalidated device data.  Last Pain:  Vitals:   12/20/19 0927  TempSrc:   PainSc: 0-No pain      Patients Stated Pain Goal: 4 (30/94/07 6808)  Complications: No complications documented.

## 2019-12-20 NOTE — Op Note (Signed)
Operative Note  Preoperative diagnosis:  1.  1.5 cm left mid pole stone 2.  1.0 cm left lower pole stone 3.  5 mm left upper pole stone 4.  Left renal mass  Postoperative diagnosis: Same  Procedure(s): 1.  Cystoscopy with left ureteroscopy, holmium laser lithotripsy and left JJ stent placement 2.  Left retrograde pyelogram with intraoperative interpretation of fluoroscopic imaging  Surgeon: Ellison Hughs, MD  Assistants:  None  Anesthesia:  General  Complications:  None  EBL: Less than 5 mL  Specimens: 1.  Left renal stones  Drains/Catheters: 1.  Left 6 French, 24 cm JJ stent without tether  Intraoperative findings:   1. Multiple large and extremely hard left renal pelvis stones  Indication:  Elaine Harris is a 74 y.o. female with multiple large left renal pelvis stones along with a solid and enhancing left renal mass.  She is here today to address her stone burden prior to left partial nephrectomy in the coming weeks.  She has been consented for the above procedures, voices understanding and wishes to proceed.  Description of procedure:  After informed consent was obtained, the patient was brought to the operating room and general LMA anesthesia was administered. The patient was then placed in the dorsolithotomy position and prepped and draped in the usual sterile fashion. A timeout was performed. A 23 French rigid cystoscope was then inserted into the urethral meatus and advanced into the bladder under direct vision. A complete bladder survey revealed no intravesical pathology.  A 5 French ureteral catheter was then inserted into the left ureteral orifice and a retrograde pyelogram was obtained, with the findings listed above.  A Glidewire was then used to intubate the lumen of the ureteral catheter and was advanced up to the left renal pelvis, under fluoroscopic guidance.  The catheter was then removed, leaving the wire in place.  An additional sensor wire was  then placed up the left ureter to the left renal pelvis, under fluoroscopic guidance.  A 14 French ureteral access sheath was then advanced over the sensor wire and into the proximal aspects of the left ureter, under fluoroscopic guidance.  The flexible ureteroscope was then advanced through the access sheath and up into the left kidney.  A complete survey of the left renal pelvis and its associated calyces revealed the findings listed above with no other pathology.  A 200 m holmium laser was then used to fracture her multiple large stones into numerous smaller pieces.  The stones were extremely hard and a total of four 200 m laser fiber was were used to address her stone burden.  I attempted to extract a number of her larger stone fragments, but this caused mucosal irritation of the UPJ.  I was able to remove several stone fragments, but there were numerous remaining in the mid and lower pole calyces.  Out of concern for damage to the left UPJ, I have abandoned stone extraction.  The ureteral access sheath was then removed under direct vision, revealing no ureteral trauma or ureteral stone burden.  A 6 French, 24 cm JJ stent was then placed over the Glidewire and into good position within the left collecting system, confirming placement via fluoroscopy.  The patient's bladder was then drained.  She tolerated the procedure well and was transferred to the postanesthesia in stable condition.  Plan: I will plan to bring the patient back for a second look ureteroscopy in 1 week.  I need to assure that she is relatively  clear of her significant stone burden before proceeding with a left partial nephrectomy.

## 2019-12-21 ENCOUNTER — Encounter (HOSPITAL_BASED_OUTPATIENT_CLINIC_OR_DEPARTMENT_OTHER): Payer: Self-pay | Admitting: Urology

## 2019-12-21 ENCOUNTER — Other Ambulatory Visit: Payer: Self-pay

## 2019-12-21 NOTE — Progress Notes (Signed)
Spoke w/ via phone for pre-op interview--- PT Lab needs dos----  no             Lab results------ current lab (CBC,BMP) done 12-19-2019 results in epic;  Current ekg done 12-19-2019 in epic/ chart COVID test ------ 12-24-2019 @ 0925 Arrive at ------- 0930 NPO after MN NO Solid Food.  Clear liquids from MN until--- 0830 Medications to take morning of surgery ----- Flomax w/ sips of water and if needed norco/ zofran Diabetic medication ----- n/a Patient Special Instructions ----- n/a Pre-Op special Istructions ----- n/a Patient verbalized understanding of instructions that were given at this phone interview. Patient denies shortness of breath, chest pain, fever, cough at this phone interview.

## 2019-12-24 ENCOUNTER — Other Ambulatory Visit (HOSPITAL_COMMUNITY)
Admission: RE | Admit: 2019-12-24 | Discharge: 2019-12-24 | Disposition: A | Discharge status: Home / Self Care | Payer: Medicare Other | Source: Ambulatory Visit | Attending: Urology | Admitting: Urology

## 2019-12-24 DIAGNOSIS — Z01812 Encounter for preprocedural laboratory examination: Secondary | ICD-10-CM | POA: Insufficient documentation

## 2019-12-24 DIAGNOSIS — Z20822 Contact with and (suspected) exposure to covid-19: Secondary | ICD-10-CM | POA: Diagnosis not present

## 2019-12-24 LAB — SARS CORONAVIRUS 2 (TAT 6-24 HRS): SARS Coronavirus 2: NEGATIVE

## 2019-12-26 ENCOUNTER — Encounter (HOSPITAL_COMMUNITY): Payer: Self-pay | Admitting: Urology

## 2019-12-27 NOTE — Anesthesia Preprocedure Evaluation (Addendum)
Anesthesia Evaluation  Patient identified by MRN, date of birth, ID band Patient awake    Reviewed: Allergy & Precautions, H&P , NPO status , Patient's Chart, lab work & pertinent test results  History of Anesthesia Complications (+) PONV  Airway Mallampati: II  TM Distance: >3 FB Neck ROM: Full    Dental no notable dental hx. (+) Teeth Intact, Dental Advisory Given   Pulmonary neg pulmonary ROS,    Pulmonary exam normal breath sounds clear to auscultation       Cardiovascular Exercise Tolerance: Good hypertension, Pt. on medications  Rhythm:Regular Rate:Normal     Neuro/Psych negative neurological ROS  negative psych ROS   GI/Hepatic negative GI ROS, Neg liver ROS,   Endo/Other  negative endocrine ROS  Renal/GU negative Renal ROS  negative genitourinary   Musculoskeletal  (+) Arthritis , Osteoarthritis,    Abdominal   Peds  Hematology negative hematology ROS (+)   Anesthesia Other Findings   Reproductive/Obstetrics negative OB ROS                            Anesthesia Physical Anesthesia Plan  ASA: II  Anesthesia Plan: General   Post-op Pain Management:    Induction: Intravenous  PONV Risk Score and Plan: 4 or greater and Ondansetron, Dexamethasone and Midazolam  Airway Management Planned: LMA  Additional Equipment:   Intra-op Plan:   Post-operative Plan: Extubation in OR  Informed Consent: I have reviewed the patients History and Physical, chart, labs and discussed the procedure including the risks, benefits and alternatives for the proposed anesthesia with the patient or authorized representative who has indicated his/her understanding and acceptance.     Dental advisory given  Plan Discussed with: CRNA  Anesthesia Plan Comments:        Anesthesia Quick Evaluation

## 2019-12-28 ENCOUNTER — Ambulatory Visit (HOSPITAL_BASED_OUTPATIENT_CLINIC_OR_DEPARTMENT_OTHER): Payer: Medicare Other | Admitting: Anesthesiology

## 2019-12-28 ENCOUNTER — Ambulatory Visit (HOSPITAL_BASED_OUTPATIENT_CLINIC_OR_DEPARTMENT_OTHER)
Admission: RE | Admit: 2019-12-28 | Discharge: 2019-12-28 | Disposition: A | Discharge status: Home / Self Care | Payer: Medicare Other | Attending: Urology | Admitting: Urology

## 2019-12-28 ENCOUNTER — Other Ambulatory Visit: Payer: Self-pay

## 2019-12-28 ENCOUNTER — Encounter (HOSPITAL_BASED_OUTPATIENT_CLINIC_OR_DEPARTMENT_OTHER)
Admission: RE | Disposition: A | Discharge status: Home / Self Care | Payer: Self-pay | Source: Home / Self Care | Attending: Urology

## 2019-12-28 ENCOUNTER — Encounter (HOSPITAL_BASED_OUTPATIENT_CLINIC_OR_DEPARTMENT_OTHER): Payer: Self-pay | Admitting: Urology

## 2019-12-28 DIAGNOSIS — M1990 Unspecified osteoarthritis, unspecified site: Secondary | ICD-10-CM | POA: Diagnosis not present

## 2019-12-28 DIAGNOSIS — Z87442 Personal history of urinary calculi: Secondary | ICD-10-CM | POA: Insufficient documentation

## 2019-12-28 DIAGNOSIS — R7303 Prediabetes: Secondary | ICD-10-CM | POA: Diagnosis not present

## 2019-12-28 DIAGNOSIS — I10 Essential (primary) hypertension: Secondary | ICD-10-CM | POA: Diagnosis not present

## 2019-12-28 DIAGNOSIS — N200 Calculus of kidney: Secondary | ICD-10-CM | POA: Insufficient documentation

## 2019-12-28 DIAGNOSIS — N2889 Other specified disorders of kidney and ureter: Secondary | ICD-10-CM | POA: Diagnosis not present

## 2019-12-28 HISTORY — DX: Unspecified osteoarthritis, unspecified site: M19.90

## 2019-12-28 HISTORY — DX: Calculus of ureter: N20.1

## 2019-12-28 HISTORY — DX: Urgency of urination: R39.15

## 2019-12-28 HISTORY — DX: Other specified disorders of kidney and ureter: N28.89

## 2019-12-28 HISTORY — DX: Personal history of urinary calculi: Z87.442

## 2019-12-28 HISTORY — PX: CYSTOSCOPY/URETEROSCOPY/HOLMIUM LASER/STENT PLACEMENT: SHX6546

## 2019-12-28 SURGERY — CYSTOSCOPY/URETEROSCOPY/HOLMIUM LASER/STENT PLACEMENT
Anesthesia: General | Site: Ureter | Laterality: Left

## 2019-12-28 MED ORDER — LIDOCAINE 2% (20 MG/ML) 5 ML SYRINGE
INTRAMUSCULAR | Status: DC | PRN
  Administered 2019-12-28 (×2): 60 mg via INTRAVENOUS

## 2019-12-28 MED ORDER — PROPOFOL 10 MG/ML IV BOLUS
INTRAVENOUS | Status: DC | PRN
  Administered 2019-12-28: 20 mg via INTRAVENOUS
  Administered 2019-12-28: 30 mg via INTRAVENOUS
  Administered 2019-12-28: 130 mg via INTRAVENOUS

## 2019-12-28 MED ORDER — KETOROLAC TROMETHAMINE 10 MG PO TABS
10.0000 mg | ORAL_TABLET | Freq: Four times a day (QID) | ORAL | 0 refills | Status: DC | PRN
Start: 2019-12-28 — End: 2020-02-19

## 2019-12-28 MED ORDER — SODIUM CHLORIDE 0.9 % IR SOLN
Status: DC | PRN
  Administered 2019-12-28: 3000 mL

## 2019-12-28 MED ORDER — PHENYLEPHRINE HCL (PRESSORS) 10 MG/ML IV SOLN
INTRAVENOUS | Status: DC | PRN
  Administered 2019-12-28: 40 ug via INTRAVENOUS

## 2019-12-28 MED ORDER — KETOROLAC TROMETHAMINE 30 MG/ML IJ SOLN
INTRAMUSCULAR | Status: AC
  Filled 2019-12-28: qty 1

## 2019-12-28 MED ORDER — KETOROLAC TROMETHAMINE 30 MG/ML IJ SOLN
INTRAMUSCULAR | Status: DC | PRN
  Administered 2019-12-28: 15 mg via INTRAVENOUS

## 2019-12-28 MED ORDER — PROPOFOL 10 MG/ML IV BOLUS
INTRAVENOUS | Status: AC
  Filled 2019-12-28: qty 40

## 2019-12-28 MED ORDER — LIDOCAINE 2% (20 MG/ML) 5 ML SYRINGE
INTRAMUSCULAR | Status: AC
  Filled 2019-12-28: qty 5

## 2019-12-28 MED ORDER — DEXAMETHASONE SODIUM PHOSPHATE 4 MG/ML IJ SOLN
INTRAMUSCULAR | Status: DC | PRN
  Administered 2019-12-28: 5 mg via INTRAVENOUS

## 2019-12-28 MED ORDER — EPHEDRINE SULFATE 50 MG/ML IJ SOLN
INTRAMUSCULAR | Status: DC | PRN
  Administered 2019-12-28 (×4): 10 mg via INTRAVENOUS

## 2019-12-28 MED ORDER — LACTATED RINGERS IV SOLN: INTRAVENOUS | Status: DC

## 2019-12-28 MED ORDER — CEFAZOLIN SODIUM-DEXTROSE 2-4 GM/100ML-% IV SOLN
INTRAVENOUS | Status: AC
  Filled 2019-12-28: qty 100

## 2019-12-28 MED ORDER — CEFAZOLIN SODIUM-DEXTROSE 2-4 GM/100ML-% IV SOLN
2.0000 g | Freq: Once | INTRAVENOUS | Status: AC
  Administered 2019-12-28: 2 g via INTRAVENOUS

## 2019-12-28 MED ORDER — ACETAMINOPHEN 500 MG PO TABS
ORAL_TABLET | ORAL | Status: AC
  Filled 2019-12-28: qty 2

## 2019-12-28 MED ORDER — FENTANYL CITRATE (PF) 100 MCG/2ML IJ SOLN: 25.0000 ug | INTRAMUSCULAR | Status: DC | PRN

## 2019-12-28 MED ORDER — FENTANYL CITRATE (PF) 100 MCG/2ML IJ SOLN
INTRAMUSCULAR | Status: AC
  Filled 2019-12-28: qty 2

## 2019-12-28 MED ORDER — ONDANSETRON HCL 4 MG/2ML IJ SOLN
INTRAMUSCULAR | Status: DC | PRN
  Administered 2019-12-28: 4 mg via INTRAVENOUS

## 2019-12-28 MED ORDER — FENTANYL CITRATE (PF) 100 MCG/2ML IJ SOLN
INTRAMUSCULAR | Status: DC | PRN
  Administered 2019-12-28 (×3): 25 ug via INTRAVENOUS
  Administered 2019-12-28: 12.5 ug via INTRAVENOUS
  Administered 2019-12-28: 25 ug via INTRAVENOUS
  Administered 2019-12-28: 12.5 ug via INTRAVENOUS
  Administered 2019-12-28: 25 ug via INTRAVENOUS

## 2019-12-28 MED ORDER — DEXAMETHASONE SODIUM PHOSPHATE 10 MG/ML IJ SOLN
INTRAMUSCULAR | Status: AC
  Filled 2019-12-28: qty 1

## 2019-12-28 MED ORDER — ACETAMINOPHEN 500 MG PO TABS
1000.0000 mg | ORAL_TABLET | Freq: Once | ORAL | Status: AC
  Administered 2019-12-28: 1000 mg via ORAL

## 2019-12-28 MED ORDER — PHENYLEPHRINE 40 MCG/ML (10ML) SYRINGE FOR IV PUSH (FOR BLOOD PRESSURE SUPPORT)
PREFILLED_SYRINGE | INTRAVENOUS | Status: AC
  Filled 2019-12-28: qty 10

## 2019-12-28 MED ORDER — EPHEDRINE 5 MG/ML INJ
INTRAVENOUS | Status: AC
  Filled 2019-12-28: qty 10

## 2019-12-28 MED ORDER — ONDANSETRON HCL 4 MG/2ML IJ SOLN
INTRAMUSCULAR | Status: AC
  Filled 2019-12-28: qty 2

## 2019-12-28 SURGICAL SUPPLY — 29 items
APL SKNCLS STERI-STRIP NONHPOA (GAUZE/BANDAGES/DRESSINGS)
BAG DRAIN URO-CYSTO SKYTR STRL (DRAIN) ×2 IMPLANT
BAG DRN UROCATH (DRAIN) ×1
BASKET STONE 1.7 NGAGE (UROLOGICAL SUPPLIES) ×1 IMPLANT
BASKET ZERO TIP NITINOL 2.4FR (BASKET) ×1 IMPLANT
BENZOIN TINCTURE PRP APPL 2/3 (GAUZE/BANDAGES/DRESSINGS) IMPLANT
BSKT STON RTRVL ZERO TP 2.4FR (BASKET)
CATH URET 5FR 28IN OPEN ENDED (CATHETERS) ×1 IMPLANT
CLOTH BEACON ORANGE TIMEOUT ST (SAFETY) ×2 IMPLANT
FIBER LASER FLEXIVA 365 (UROLOGICAL SUPPLIES) IMPLANT
FIBER LASER TRAC TIP (UROLOGICAL SUPPLIES) ×2 IMPLANT
GLOVE BIO SURGEON STRL SZ7.5 (GLOVE) ×2 IMPLANT
GLOVE BIOGEL PI IND STRL 6.5 (GLOVE) IMPLANT
GLOVE BIOGEL PI INDICATOR 6.5 (GLOVE) ×2
GOWN STRL REUS W/TWL LRG LVL3 (GOWN DISPOSABLE) ×1 IMPLANT
GOWN STRL REUS W/TWL XL LVL3 (GOWN DISPOSABLE) ×2 IMPLANT
GUIDEWIRE STR DUAL SENSOR (WIRE) ×1 IMPLANT
GUIDEWIRE ZIPWRE .038 STRAIGHT (WIRE) ×2 IMPLANT
IV NS IRRIG 3000ML ARTHROMATIC (IV SOLUTION) ×3 IMPLANT
KIT TURNOVER CYSTO (KITS) ×2 IMPLANT
MANIFOLD NEPTUNE II (INSTRUMENTS) ×2 IMPLANT
NS IRRIG 500ML POUR BTL (IV SOLUTION) ×2 IMPLANT
PACK CYSTO (CUSTOM PROCEDURE TRAY) ×2 IMPLANT
SHEATH URET ACCESS 12FR/35CM (UROLOGICAL SUPPLIES) ×1 IMPLANT
STENT URET 6FRX24 CONTOUR (STENTS) ×1 IMPLANT
STRIP CLOSURE SKIN 1/2X4 (GAUZE/BANDAGES/DRESSINGS) IMPLANT
SYR 10ML LL (SYRINGE) ×2 IMPLANT
TUBE CONNECTING 12X1/4 (SUCTIONS) ×1 IMPLANT
TUBING UROLOGY SET (TUBING) ×2 IMPLANT

## 2019-12-28 NOTE — Transfer of Care (Signed)
Immediate Anesthesia Transfer of Care Note  Patient: Elaine Harris  Procedure(s) Performed: Procedure(s) (LRB): CYSTOSCOPY/URETEROSCOPY/HOLMIUM LASER/STENT EXCHANGE (Left)  Patient Location: PACU  Anesthesia Type: General  Level of Consciousness: awake, sedated, patient cooperative and responds to stimulation  Airway & Oxygen Therapy: Patient Spontanous Breathing and Patient connected to Young 02 and soft FM   Post-op Assessment: Report given to PACU RN, Post -op Vital signs reviewed and stable and Patient moving all extremities  Post vital signs: Reviewed and stable  Complications: No apparent anesthesia complications

## 2019-12-28 NOTE — Op Note (Signed)
Operative Note  Preoperative diagnosis:  1.  Left renal calculi 2.  Left renal mass  Postoperative diagnosis: 1.  Left renal calculi 2.  Left renal mass  Procedure(s): 1.  Cystoscopy with left ureteroscopy, holmium laser lithotripsy and left JJ stent exchange  Surgeon: Ellison Hughs, MD  Assistants:  None  Anesthesia:  General  Complications:  None  EBL: Less than 5 mL  Specimens: 1.  Previously placed left JJ stent was removed intact, inspected and discarded 2.  Left renal calculi  Drains/Catheters: 1.  Left 6 French, 24 cm JJ stent without tether  Intraoperative findings:   1. Numerous stone fragments within the lower pole calyces of the left renal pelvis  Indication:  Elaine Harris is a 74 y.o. female with several large 1+ centimeter left renal calculi as well as a solid and enhancing left renal mass.  She underwent left-sided ureteroscopy on 12/20/2019 due to her significant stone burden, is needing a repeat evaluation to assure that her stone burden is clear prior to her upcoming partial nephrectomy.  She has been consented for the above procedures, voices understanding and wishes to proceed.  Description of procedure:  After informed consent was obtained, the patient was brought to the operating room and general LMA anesthesia was administered. The patient was then placed in the dorsolithotomy position and prepped and draped in the usual sterile fashion. A timeout was performed. A 23 French rigid cystoscope was then inserted into the urethral meatus and advanced into the bladder under direct vision. A complete bladder survey revealed no intravesical pathology.  Her previously placed left JJ stent was then grasped at its distal curl and retracted to the urethral meatus.  A Glidewire was then advanced to the lumen of the stent up to the left renal pelvis, or fluoroscopic guidance.  The previously placed stent was then removed over the wire intact, inspected and  discarded.  An additional sensor wire was then placed alongside the Glidewire and advanced up to the left renal pelvis.  A 14 French, 35 cm ureteral access sheath was then advanced over the sensor wire and into the proximal aspects of the left ureter, under fluoroscopic guidance.  The flexible ureteroscope was then advanced through the lumen of the access sheath and into the left renal pelvis where numerous stone fragments were identified in the lower pole calyces.  A 200 m holmium laser was then used to dust or residual stone fragments.  Complete inspection of her remaining left renal calyces were revealed no additional stone burden.  The flexible ureteroscope and ureteral access sheath were then removed under direct vision with no evidence of ureteral trauma or stone burden.  A 6 French, 24 cm JJ stent was then placed over the wire and into good position within the left collecting system, confirming placement via fluoroscopy.  The patient's bladder was then drained.  She tolerated the procedure well was transferred to the postanesthesia in stable condition.  Plan: Follow-up on 01/02/2020 for office cystoscopy and stent removal.

## 2019-12-28 NOTE — Discharge Instructions (Signed)

## 2019-12-28 NOTE — Anesthesia Postprocedure Evaluation (Signed)
Anesthesia Post Note  Patient: Elaine Harris  Procedure(s) Performed: CYSTOSCOPY/URETEROSCOPY/HOLMIUM LASER/STENT EXCHANGE (Left Ureter)     Patient location during evaluation: PACU Anesthesia Type: General Level of consciousness: awake and alert Pain management: pain level controlled Vital Signs Assessment: post-procedure vital signs reviewed and stable Respiratory status: spontaneous breathing, nonlabored ventilation and respiratory function stable Cardiovascular status: blood pressure returned to baseline and stable Postop Assessment: no apparent nausea or vomiting Anesthetic complications: no   No complications documented.  Last Vitals:  Vitals:   12/28/19 1000 12/28/19 1015  BP: (!) 141/58   Pulse: 71 75  Resp: 16 20  Temp:    SpO2: 93% 97%    Last Pain:  Vitals:   12/28/19 1000  TempSrc:   PainSc: 0-No pain                 Rudell Ortman,W. EDMOND

## 2019-12-28 NOTE — H&P (Signed)
Urology Preoperative H&P   Chief Complaint: left renal stones  History of Present Illness: Elaine Harris is a 74 y.o. female with a history of kidney stones and a solid and enhancing left renal mass.  She underwent left ureteroscopy on 12/20/19 to address her significant stone burden.  Due to the volume of stone debris and concern for residual stone burden during her upcoming partial nephrectomy, the patient is here today for repeat ureteroscopy.  She has done well since her initial surgery and denies N/V/F/C.  She reports left side stent discomfort and lower urinary tract symptoms.      Past Medical History:  Diagnosis Date  . Arthritis   . History of kidney stones   . Hypertension    followed by pcp   (12-21-2019 per pt never had a stress test)  . Left renal mass    urologist-- dr Synda Bagent---  probable cancerous  . Left ureteral stone   . Pneumonia   . PONV (postoperative nausea and vomiting)    after hysterectomy  . Pre-diabetes    followed by pcp  . Urgency of urination     Past Surgical History:  Procedure Laterality Date  . ABDOMINAL HYSTERECTOMY  1984   W/  RSO  . BREAST SURGERY  1986  . CATARACT EXTRACTION W/ INTRAOCULAR LENS  IMPLANT, BILATERAL Bilateral 2016  . CYSTOSCOPY WITH STENT PLACEMENT Left 10-22-2009 @[facility]   . CYSTOSCOPY/URETEROSCOPY/HOLMIUM LASER/STENT PLACEMENT Left 12/20/2019   Procedure: CYSTOSCOPY/RETROGRADE/URETEROSCOPY/HOLMIUM LASER/STENT PLACEMENT;  Surgeon: Ceasar Mons, MD;  Location: [facility];  Service: Urology;  Laterality: Left;  . DILATION AND CURETTAGE OF UTERUS  1983  . EXTRACORPOREAL SHOCK WAVE LITHOTRIPSY  10-25-2009;  07-17-2014  @[facility]   . FOOT SURGERY Right 2014   d/t athritis , put pin in it, still in place.  Marland Kitchen KIDNEY SURGERY Bilateral 1978   open removal calculi  . ROTATOR CUFF REPAIR Right 2008    Allergies:  Allergies  Allergen Reactions  . Other     Pollen-runny nose and cough    History reviewed. No pertinent family  history.  Social History:  reports that she has never smoked. She has never used smokeless tobacco. She reports that she does not drink alcohol and does not use drugs.  ROS: A complete review of systems was performed.  All systems are negative except for pertinent findings as noted.  Physical Exam:  Vital signs in last 24 hours: Temp:  [99.1 F (37.3 C)] 99.1 F (37.3 C) (08/11 0719) Pulse Rate:  [67] 67 (08/11 0719) Resp:  [18] 18 (08/11 0719) BP: (157)/(85) 157/85 (08/11 0719) SpO2:  [99 %] 99 % (08/11 0719) Weight:  [86 kg] 86 kg (08/11 0719) Constitutional:  Alert and oriented, No acute distress Cardiovascular: Regular rate and rhythm, No JVD Respiratory: Normal respiratory effort, Lungs clear bilaterally GI: Abdomen is soft, nontender, nondistended, no abdominal masses GU: No CVA tenderness Lymphatic: No lymphadenopathy Neurologic: Grossly intact, no focal deficits Psychiatric: Normal mood and affect  Laboratory Data:  No results for input(s): WBC, HGB, HCT, PLT in the last 72 hours.  No results for input(s): NA, K, CL, GLUCOSE, BUN, CALCIUM, CREATININE in the last 72 hours.  Invalid input(s): CO3   No results found for this or any previous visit (from the past 24 hour(s)). Recent Results (from the past 240 hour(s))  SARS CORONAVIRUS 2 (TAT 6-24 HRS) Nasopharyngeal Nasopharyngeal Swab     Status: None   Collection Time: 12/24/19  1:42 PM   Specimen: Nasopharyngeal  Swab  Result Value Ref Range Status   SARS Coronavirus 2 NEGATIVE NEGATIVE Final    Comment: (NOTE) SARS-CoV-2 target nucleic acids are NOT DETECTED.  The SARS-CoV-2 RNA is generally detectable in upper and lower respiratory specimens during the acute phase of infection. Negative results do not preclude SARS-CoV-2 infection, do not rule out co-infections with other pathogens, and should not be used as the sole basis for treatment or other patient management decisions. Negative results must be combined  with clinical observations, patient history, and epidemiological information. The expected result is Negative.  Fact Sheet for Patients: SugarRoll.be  Fact Sheet for Healthcare Providers: https://www.woods-mathews.com/  This test is not yet approved or cleared by the [place] FDA and  has been authorized for detection and/or diagnosis of SARS-CoV-2 by FDA under an Emergency Use Authorization (EUA). This EUA will remain  in effect (meaning this test can be used) for the duration of the COVID-19 declaration under Se ction 564(b)(1) of the Act, 21 U.S.C. section 360bbb-3(b)(1), unless the authorization is terminated or revoked sooner.  Performed at [facility], [place] [address] [place], [place] [address]     Renal Function: No results for input(s): CREATININE in the last 168 hours. Estimated Creatinine Clearance: 65.5 mL/min (by C-G formula based on SCr of 0.74 mg/dL).  Radiologic Imaging: No results found.  I independently reviewed the above imaging studies.  Assessment and Plan Elaine Harris is a 74 y.o. female with a large stone burden in the left renal pelvis along with a solid left renal mass  The risks, benefits and alternatives of cystoscopy with LEFT ureteroscopy, laser lithotripsy and ureteral stent placement was discussed the patient.  Risks included, but are not limited to: bleeding, urinary tract infection, ureteral injury/avulsion, ureteral stricture formation, retained stone fragments, the possibility that multiple surgeries may be required to treat the stone(s), MI, stroke, PE and the inherent risks of general anesthesia.  The patient voices understanding and wishes to proceed.      Ellison Hughs, MD 12/28/2019, 7:46 AM  [facility] Pager: [phone]

## 2019-12-28 NOTE — Anesthesia Procedure Notes (Signed)
Procedure Name: LMA Insertion Date/Time: 12/28/2019 8:33 AM Performed by: Justice Rocher, CRNA Pre-anesthesia Checklist: Patient identified, Emergency Drugs available, Suction available, Patient being monitored and Timeout performed Patient Re-evaluated:Patient Re-evaluated prior to induction Oxygen Delivery Method: Circle system utilized Preoxygenation: Pre-oxygenation with 100% oxygen Induction Type: IV induction Ventilation: Mask ventilation without difficulty LMA: LMA inserted LMA Size: 4.0 Number of attempts: 1 Airway Equipment and Method: Bite block Placement Confirmation: positive ETCO2,  breath sounds checked- equal and bilateral and CO2 detector Tube secured with: Tape Dental Injury: Teeth and Oropharynx as per pre-operative assessment

## 2019-12-29 ENCOUNTER — Encounter (HOSPITAL_BASED_OUTPATIENT_CLINIC_OR_DEPARTMENT_OTHER): Payer: Self-pay | Admitting: Urology

## 2020-01-02 DIAGNOSIS — N200 Calculus of kidney: Secondary | ICD-10-CM | POA: Diagnosis not present

## 2020-01-02 DIAGNOSIS — N3020 Other chronic cystitis without hematuria: Secondary | ICD-10-CM | POA: Diagnosis not present

## 2020-01-02 DIAGNOSIS — D49512 Neoplasm of unspecified behavior of left kidney: Secondary | ICD-10-CM | POA: Diagnosis not present

## 2020-01-09 ENCOUNTER — Other Ambulatory Visit: Payer: Self-pay | Admitting: Urology

## 2020-01-10 DIAGNOSIS — N200 Calculus of kidney: Secondary | ICD-10-CM | POA: Diagnosis not present

## 2020-01-10 DIAGNOSIS — D49512 Neoplasm of unspecified behavior of left kidney: Secondary | ICD-10-CM | POA: Diagnosis not present

## 2020-02-06 NOTE — Progress Notes (Signed)
DUE TO COVID-19 ONLY ONE VISITOR IS ALLOWED TO COME WITH YOU AND STAY IN THE WAITING ROOM ONLY DURING PRE OP AND PROCEDURE DAY OF SURGERY. THE 1 VISITOR  MAY VISIT WITH YOU AFTER SURGERY IN YOUR PRIVATE ROOM DURING VISITING HOURS ONLY!  YOU NEED TO HAVE A COVID 19 TEST ON_9/28/2021 ______ @_______ , THIS TEST MUST BE DONE BEFORE SURGERY,  COVID TESTING SITE [address] [place] [address], IT IS ON THE RIGHT GOING OUT [address] APPROXIMATELY  2 MINUTES PAST [employer] ON THE RIGHT. ONCE YOUR COVID TEST IS COMPLETED,  PLEASE BEGIN THE QUARANTINE INSTRUCTIONS AS OUTLINED IN YOUR HANDOUT.                Elaine Harris  02/06/2020   Your procedure is scheduled on: 02/17/2020    Report to [facility] Main  Entrance   Report to admitting at      1030 AM     Call this number if you have problems the morning of surgery [phone]    Remember: Do not eat food , candy gum or mints :After Midnight. You may have clear liquids from midnight until 0930am    CLEAR LIQUID DIET   Foods Allowed                                                                       Coffee and tea, regular and decaf                              Plain Jell-O any favor except red or purple                                            Fruit ices (not with fruit pulp)                                      Iced Popsicles                                     Carbonated beverages, regular and diet                                    Cranberry, grape and apple juices Sports drinks like Gatorade Lightly seasoned clear broth or consume(fat free) Sugar, honey syrup   _____________________________________________________________________    BRUSH YOUR TEETH MORNING OF SURGERY AND RINSE YOUR MOUTH OUT, NO CHEWING GUM CANDY OR MINTS.     Take these medicines the morning of surgery with A SIP OF WATER:       None                                 You may not have any metal on your body  including hair  pins and              piercings  Do not wear jewelry, make-up, lotions, powders or perfumes, deodorant             Do not wear nail polish on your fingernails.  Do not shave  48 hours prior to surgery.              Men may shave face and neck.   Do not bring valuables to the hospital. [place].  Contacts, dentures or bridgework may not be worn into surgery.  Leave suitcase in the car. After surgery it may be brought to your room.     Patients discharged the day of surgery will not be allowed to drive home. IF YOU ARE HAVING SURGERY AND GOING HOME THE SAME DAY, YOU MUST HAVE AN ADULT TO DRIVE YOU HOME AND BE WITH YOU FOR 24 HOURS. YOU MAY GO HOME BY TAXI OR UBER OR ORTHERWISE, BUT AN ADULT MUST ACCOMPANY YOU HOME AND STAY WITH YOU FOR 24 HOURS.  Name and phone number of your driver:  Special Instructions: N/A              Please read over the following fact sheets you were given: _____________________________________________________________________  [facility] - Preparing for Surgery Before surgery, you can play an important role.  Because skin is not sterile, your skin needs to be as free of germs as possible.  You can reduce the number of germs on your skin by washing with CHG (chlorahexidine gluconate) soap before surgery.  CHG is an antiseptic cleaner which kills germs and bonds with the skin to continue killing germs even after washing. Please DO NOT use if you have an allergy to CHG or antibacterial soaps.  If your skin becomes reddened/irritated stop using the CHG and inform your nurse when you arrive at Short Stay. Do not shave (including legs and underarms) for at least 48 hours prior to the first CHG shower.  You may shave your face/neck. Please follow these instructions carefully:  1.  Shower with CHG Soap the night before surgery and the  morning of Surgery.  2.  If you choose to wash your hair, wash your hair first  as usual with your  normal  shampoo.  3.  After you shampoo, rinse your hair and body thoroughly to remove the  shampoo.                           4.  Use CHG as you would any other liquid soap.  You can apply chg directly  to the skin and wash                       Gently with a scrungie or clean washcloth.  5.  Apply the CHG Soap to your body ONLY FROM THE NECK DOWN.   Do not use on face/ open                           Wound or open sores. Avoid contact with eyes, ears mouth and genitals (private parts).                       Wash face,  Genitals (private parts) with your  normal soap.             6.  Wash thoroughly, paying special attention to the area where your surgery  will be performed.  7.  Thoroughly rinse your body with warm water from the neck down.  8.  DO NOT shower/wash with your normal soap after using and rinsing off  the CHG Soap.                9.  Pat yourself dry with a clean towel.            10.  Wear clean pajamas.            11.  Place clean sheets on your bed the night of your first shower and do not  sleep with pets. Day of Surgery : Do not apply any lotions/deodorants the morning of surgery.  Please wear clean clothes to the hospital/surgery center.  FAILURE TO FOLLOW THESE INSTRUCTIONS MAY RESULT IN THE CANCELLATION OF YOUR SURGERY PATIENT SIGNATURE_________________________________  NURSE SIGNATURE__________________________________  ________________________________________________________________________

## 2020-02-07 ENCOUNTER — Encounter (HOSPITAL_COMMUNITY)
Admission: RE | Admit: 2020-02-07 | Discharge: 2020-02-07 | Disposition: A | Discharge status: Home / Self Care | Payer: Medicare Other | Source: Ambulatory Visit | Attending: Urology | Admitting: Urology

## 2020-02-07 ENCOUNTER — Encounter (HOSPITAL_COMMUNITY): Payer: Self-pay

## 2020-02-07 ENCOUNTER — Other Ambulatory Visit: Payer: Self-pay

## 2020-02-07 DIAGNOSIS — Z01812 Encounter for preprocedural laboratory examination: Secondary | ICD-10-CM | POA: Insufficient documentation

## 2020-02-07 LAB — BASIC METABOLIC PANEL
Anion gap: 12 (ref 5–15)
BUN: 19 mg/dL (ref 8–23)
CO2: 24 mmol/L (ref 22–32)
Calcium: 9.1 mg/dL (ref 8.9–10.3)
Chloride: 104 mmol/L (ref 98–111)
Creatinine, Ser: 0.65 mg/dL (ref 0.44–1.00)
GFR calc Af Amer: >60 mL/min (ref >60)
GFR calc non Af Amer: >60 mL/min (ref >60)
Glucose, Bld: 101 mg/dL — ABNORMAL HIGH (ref 70–99)
Potassium: 3.7 mmol/L (ref 3.5–5.1)
Sodium: 140 mmol/L (ref 135–145)

## 2020-02-07 LAB — CBC
HCT: 43.5 % (ref 36.0–46.0)
Hemoglobin: 14 g/dL (ref 12.0–15.0)
MCH: 29.6 pg (ref 26.0–34.0)
MCHC: 32.2 g/dL (ref 30.0–36.0)
MCV: 92 fL (ref 80.0–100.0)
Platelets: 225 10*3/uL (ref 150–400)
RBC: 4.73 MIL/uL (ref 3.87–5.11)
RDW: 14.9 % (ref 11.5–15.5)
WBC: 6.4 10*3/uL (ref 4.0–10.5)
nRBC: 0 % (ref 0.0–0.2)

## 2020-02-07 NOTE — Progress Notes (Signed)
Anesthesia Review:  PCP: Dr Fara Olden- eage Cardiologist : Chest x-ray : EKG : 12/19/19  Echo : Stress test: Cardiac Cath :  Activity level: can do a flight of stairs wihtout difficulty  Sleep Study/ CPAP : Fasting Blood Sugar :      / Checks Blood Sugar -- times a day:   Blood Thinner/ Instructions /Last Dose: ASA / Instructions/ Last Dose :  12/19/19- HGBA1C- 5.9-epic

## 2020-02-09 DIAGNOSIS — N200 Calculus of kidney: Secondary | ICD-10-CM | POA: Diagnosis not present

## 2020-02-09 DIAGNOSIS — D49512 Neoplasm of unspecified behavior of left kidney: Secondary | ICD-10-CM | POA: Diagnosis not present

## 2020-02-09 DIAGNOSIS — N3000 Acute cystitis without hematuria: Secondary | ICD-10-CM | POA: Diagnosis not present

## 2020-02-14 ENCOUNTER — Other Ambulatory Visit (HOSPITAL_COMMUNITY)
Admission: RE | Admit: 2020-02-14 | Discharge: 2020-02-14 | Disposition: A | Discharge status: Home / Self Care | Payer: Medicare Other | Source: Ambulatory Visit | Attending: Urology | Admitting: Urology

## 2020-02-14 DIAGNOSIS — Z01812 Encounter for preprocedural laboratory examination: Secondary | ICD-10-CM | POA: Diagnosis not present

## 2020-02-14 DIAGNOSIS — Z20822 Contact with and (suspected) exposure to covid-19: Secondary | ICD-10-CM | POA: Insufficient documentation

## 2020-02-14 LAB — SARS CORONAVIRUS 2 (TAT 6-24 HRS): SARS Coronavirus 2: NEGATIVE

## 2020-02-16 NOTE — Progress Notes (Signed)
Received a call from [facility] at [facility]. Pt's scheduled to have surgery on 02-17-20, and tested negative for COVID on 02-14-20. Today 02-16-20, pt's husband tested postive for COVID, but patient is asymptomatic. Coni was questioning if patient could still have her surgery.  Spoke to [employer], [place]. Per Janett Billow, as long as pt remains asymptomatic, she can have her surgery tomorrow.

## 2020-02-17 ENCOUNTER — Inpatient Hospital Stay (HOSPITAL_COMMUNITY)
Admission: RE | Admit: 2020-02-17 | Discharge: 2020-02-19 | DRG: 658 | Disposition: A | Discharge status: Home / Self Care | Payer: Medicare Other | Attending: Urology | Admitting: Urology

## 2020-02-17 ENCOUNTER — Ambulatory Visit (HOSPITAL_COMMUNITY): Payer: Medicare Other | Admitting: Physician Assistant

## 2020-02-17 ENCOUNTER — Ambulatory Visit (HOSPITAL_COMMUNITY): Payer: Medicare Other | Admitting: Certified Registered Nurse Anesthetist

## 2020-02-17 ENCOUNTER — Encounter (HOSPITAL_COMMUNITY)
Admission: RE | Disposition: A | Discharge status: Home / Self Care | Payer: Self-pay | Source: Home / Self Care | Attending: Urology

## 2020-02-17 ENCOUNTER — Other Ambulatory Visit: Payer: Self-pay

## 2020-02-17 ENCOUNTER — Encounter (HOSPITAL_COMMUNITY): Payer: Self-pay | Admitting: Urology

## 2020-02-17 DIAGNOSIS — E669 Obesity, unspecified: Secondary | ICD-10-CM | POA: Diagnosis present

## 2020-02-17 DIAGNOSIS — C642 Malignant neoplasm of left kidney, except renal pelvis: Secondary | ICD-10-CM | POA: Diagnosis not present

## 2020-02-17 DIAGNOSIS — D49512 Neoplasm of unspecified behavior of left kidney: Secondary | ICD-10-CM | POA: Diagnosis not present

## 2020-02-17 DIAGNOSIS — Z6833 Body mass index (BMI) 33.0-33.9, adult: Secondary | ICD-10-CM

## 2020-02-17 DIAGNOSIS — Z20822 Contact with and (suspected) exposure to covid-19: Secondary | ICD-10-CM | POA: Diagnosis present

## 2020-02-17 DIAGNOSIS — Z8249 Family history of ischemic heart disease and other diseases of the circulatory system: Secondary | ICD-10-CM | POA: Diagnosis not present

## 2020-02-17 DIAGNOSIS — I10 Essential (primary) hypertension: Secondary | ICD-10-CM | POA: Diagnosis present

## 2020-02-17 DIAGNOSIS — Z87440 Personal history of urinary (tract) infections: Secondary | ICD-10-CM | POA: Diagnosis not present

## 2020-02-17 DIAGNOSIS — N2889 Other specified disorders of kidney and ureter: Secondary | ICD-10-CM | POA: Diagnosis not present

## 2020-02-17 DIAGNOSIS — Z801 Family history of malignant neoplasm of trachea, bronchus and lung: Secondary | ICD-10-CM | POA: Diagnosis not present

## 2020-02-17 DIAGNOSIS — J189 Pneumonia, unspecified organism: Secondary | ICD-10-CM | POA: Diagnosis not present

## 2020-02-17 DIAGNOSIS — M1990 Unspecified osteoarthritis, unspecified site: Secondary | ICD-10-CM | POA: Diagnosis not present

## 2020-02-17 DIAGNOSIS — C649 Malignant neoplasm of unspecified kidney, except renal pelvis: Secondary | ICD-10-CM

## 2020-02-17 DIAGNOSIS — Z833 Family history of diabetes mellitus: Secondary | ICD-10-CM

## 2020-02-17 DIAGNOSIS — Z87442 Personal history of urinary calculi: Secondary | ICD-10-CM

## 2020-02-17 HISTORY — PX: ROBOTIC ASSITED PARTIAL NEPHRECTOMY: SHX6087

## 2020-02-17 HISTORY — DX: Malignant neoplasm of unspecified kidney, except renal pelvis: C64.9

## 2020-02-17 LAB — ABO/RH: ABO/RH(D): O POS

## 2020-02-17 LAB — RESPIRATORY PANEL BY RT PCR (FLU A&B, COVID)
Influenza A by PCR: NEGATIVE
Influenza B by PCR: NEGATIVE
SARS Coronavirus 2 by RT PCR: NEGATIVE

## 2020-02-17 LAB — TYPE AND SCREEN
ABO/RH(D): O POS
Antibody Screen: NEGATIVE

## 2020-02-17 LAB — HEMOGLOBIN AND HEMATOCRIT, BLOOD
HCT: 38.1 % (ref 36.0–46.0)
Hemoglobin: 12.2 g/dL (ref 12.0–15.0)

## 2020-02-17 SURGERY — NEPHRECTOMY, PARTIAL, ROBOT-ASSISTED
Anesthesia: General | Site: Abdomen | Laterality: Left

## 2020-02-17 MED ORDER — PROPOFOL 10 MG/ML IV BOLUS
INTRAVENOUS | Status: AC
  Filled 2020-02-17: qty 20

## 2020-02-17 MED ORDER — DIPHENHYDRAMINE HCL 50 MG/ML IJ SOLN: 12.5000 mg | Freq: Four times a day (QID) | INTRAMUSCULAR | Status: DC | PRN

## 2020-02-17 MED ORDER — PHENYLEPHRINE 40 MCG/ML (10ML) SYRINGE FOR IV PUSH (FOR BLOOD PRESSURE SUPPORT)
PREFILLED_SYRINGE | INTRAVENOUS | Status: AC
  Filled 2020-02-17: qty 10

## 2020-02-17 MED ORDER — DIPHENHYDRAMINE HCL 12.5 MG/5ML PO ELIX
12.5000 mg | ORAL_SOLUTION | Freq: Four times a day (QID) | ORAL | Status: DC | PRN
  Filled 2020-02-17: qty 5

## 2020-02-17 MED ORDER — CEFAZOLIN SODIUM-DEXTROSE 1-4 GM/50ML-% IV SOLN
1.0000 g | Freq: Three times a day (TID) | INTRAVENOUS | Status: DC
  Administered 2020-02-17: 1 g via INTRAVENOUS

## 2020-02-17 MED ORDER — MIDAZOLAM HCL 5 MG/5ML IJ SOLN
INTRAMUSCULAR | Status: DC | PRN
  Administered 2020-02-17 (×2): 1 mg via INTRAVENOUS

## 2020-02-17 MED ORDER — CEFAZOLIN SODIUM-DEXTROSE 2-4 GM/100ML-% IV SOLN
2.0000 g | Freq: Once | INTRAVENOUS | Status: AC
  Administered 2020-02-17: 2 g via INTRAVENOUS
  Filled 2020-02-17: qty 100

## 2020-02-17 MED ORDER — BUPIVACAINE-EPINEPHRINE 0.25% -1:200000 IJ SOLN
INTRAMUSCULAR | Status: AC
  Filled 2020-02-17: qty 1

## 2020-02-17 MED ORDER — LIDOCAINE 2% (20 MG/ML) 5 ML SYRINGE
INTRAMUSCULAR | Status: DC | PRN
  Administered 2020-02-17: 40 mg via INTRAVENOUS

## 2020-02-17 MED ORDER — ONDANSETRON HCL 4 MG/2ML IJ SOLN
INTRAMUSCULAR | Status: DC | PRN
  Administered 2020-02-17: 4 mg via INTRAVENOUS

## 2020-02-17 MED ORDER — EPHEDRINE SULFATE-NACL 50-0.9 MG/10ML-% IV SOSY
PREFILLED_SYRINGE | INTRAVENOUS | Status: DC | PRN
  Administered 2020-02-17: 15 mg via INTRAVENOUS
  Administered 2020-02-17: 10 mg via INTRAVENOUS

## 2020-02-17 MED ORDER — LACTATED RINGERS IR SOLN
Status: DC | PRN
  Administered 2020-02-17: 1000 mL

## 2020-02-17 MED ORDER — CEFAZOLIN SODIUM-DEXTROSE 1-4 GM/50ML-% IV SOLN
INTRAVENOUS | Status: AC
  Filled 2020-02-17: qty 50

## 2020-02-17 MED ORDER — CEFAZOLIN SODIUM-DEXTROSE 1-4 GM/50ML-% IV SOLN
1.0000 g | Freq: Three times a day (TID) | INTRAVENOUS | Status: AC
  Administered 2020-02-18: 1 g via INTRAVENOUS
  Filled 2020-02-17: qty 50

## 2020-02-17 MED ORDER — OXYCODONE HCL 5 MG PO TABS
5.0000 mg | ORAL_TABLET | ORAL | Status: DC | PRN
  Administered 2020-02-18 (×3): 5 mg via ORAL
  Filled 2020-02-17 (×3): qty 1

## 2020-02-17 MED ORDER — FENTANYL CITRATE (PF) 250 MCG/5ML IJ SOLN
INTRAMUSCULAR | Status: AC
  Filled 2020-02-17: qty 5

## 2020-02-17 MED ORDER — HYDROCODONE-ACETAMINOPHEN 5-325 MG PO TABS
1.0000 | ORAL_TABLET | Freq: Four times a day (QID) | ORAL | 0 refills | Status: DC | PRN
Start: 2020-02-17 — End: 2023-07-21

## 2020-02-17 MED ORDER — DEXAMETHASONE SODIUM PHOSPHATE 10 MG/ML IJ SOLN
INTRAMUSCULAR | Status: AC
  Filled 2020-02-17: qty 1

## 2020-02-17 MED ORDER — ROCURONIUM BROMIDE 10 MG/ML (PF) SYRINGE
PREFILLED_SYRINGE | INTRAVENOUS | Status: DC | PRN
  Administered 2020-02-17: 60 mg via INTRAVENOUS
  Administered 2020-02-17: 10 mg via INTRAVENOUS
  Administered 2020-02-17: 20 mg via INTRAVENOUS

## 2020-02-17 MED ORDER — LIDOCAINE 2% (20 MG/ML) 5 ML SYRINGE
INTRAMUSCULAR | Status: AC
  Filled 2020-02-17: qty 5

## 2020-02-17 MED ORDER — EPHEDRINE 5 MG/ML INJ
INTRAVENOUS | Status: AC
  Filled 2020-02-17: qty 10

## 2020-02-17 MED ORDER — ONDANSETRON HCL 4 MG/2ML IJ SOLN
4.0000 mg | Freq: Once | INTRAMUSCULAR | Status: AC | PRN
  Administered 2020-02-17: 4 mg via INTRAVENOUS

## 2020-02-17 MED ORDER — ONDANSETRON HCL 4 MG/2ML IJ SOLN
INTRAMUSCULAR | Status: AC
  Filled 2020-02-17: qty 2

## 2020-02-17 MED ORDER — BUPIVACAINE LIPOSOME 1.3 % IJ SUSP
20.0000 mL | Freq: Once | INTRAMUSCULAR | Status: AC
  Administered 2020-02-17: 20 mL
  Filled 2020-02-17: qty 20

## 2020-02-17 MED ORDER — ROCURONIUM BROMIDE 10 MG/ML (PF) SYRINGE
PREFILLED_SYRINGE | INTRAVENOUS | Status: AC
  Filled 2020-02-17: qty 10

## 2020-02-17 MED ORDER — DEXAMETHASONE SODIUM PHOSPHATE 10 MG/ML IJ SOLN
INTRAMUSCULAR | Status: DC | PRN
  Administered 2020-02-17: 10 mg via INTRAVENOUS

## 2020-02-17 MED ORDER — DOCUSATE SODIUM 100 MG PO CAPS
100.0000 mg | ORAL_CAPSULE | Freq: Two times a day (BID) | ORAL | Status: DC
  Administered 2020-02-18 (×2): 100 mg via ORAL
  Filled 2020-02-17 (×2): qty 1

## 2020-02-17 MED ORDER — HYDROMORPHONE HCL 1 MG/ML IJ SOLN
0.2500 mg | INTRAMUSCULAR | Status: DC | PRN
  Administered 2020-02-17 (×2): 0.5 mg via INTRAVENOUS

## 2020-02-17 MED ORDER — SUGAMMADEX SODIUM 500 MG/5ML IV SOLN
INTRAVENOUS | Status: AC
  Filled 2020-02-17: qty 5

## 2020-02-17 MED ORDER — SUGAMMADEX SODIUM 500 MG/5ML IV SOLN
INTRAVENOUS | Status: DC | PRN
  Administered 2020-02-17: 400 mg via INTRAVENOUS

## 2020-02-17 MED ORDER — HYDROMORPHONE HCL 1 MG/ML IJ SOLN
INTRAMUSCULAR | Status: AC
Start: 2020-02-17 — End: 2020-02-18
  Filled 2020-02-17: qty 1

## 2020-02-17 MED ORDER — "VISTASEAL 4 ML SINGLE DOSE KIT "
4.0000 mL | PACK | Freq: Once | CUTANEOUS | Status: AC
  Administered 2020-02-17: 4 mL via TOPICAL
  Filled 2020-02-17: qty 4

## 2020-02-17 MED ORDER — DOCUSATE SODIUM 100 MG PO CAPS: 100.0000 mg | ORAL_CAPSULE | Freq: Two times a day (BID) | ORAL | Status: DC

## 2020-02-17 MED ORDER — DEXTROSE-NACL 5-0.45 % IV SOLN: INTRAVENOUS | Status: DC

## 2020-02-17 MED ORDER — STERILE WATER FOR IRRIGATION IR SOLN
Status: DC | PRN
  Administered 2020-02-17: 1000 mL

## 2020-02-17 MED ORDER — HYDROMORPHONE HCL 1 MG/ML IJ SOLN
0.5000 mg | INTRAMUSCULAR | Status: DC | PRN
  Administered 2020-02-17: 0.5 mg via INTRAVENOUS

## 2020-02-17 MED ORDER — LACTATED RINGERS IV SOLN: INTRAVENOUS | Status: DC

## 2020-02-17 MED ORDER — MIDAZOLAM HCL 2 MG/2ML IJ SOLN
INTRAMUSCULAR | Status: AC
  Filled 2020-02-17: qty 2

## 2020-02-17 MED ORDER — BELLADONNA ALKALOIDS-OPIUM 16.2-60 MG RE SUPP: 1.0000 | Freq: Four times a day (QID) | RECTAL | Status: DC | PRN

## 2020-02-17 MED ORDER — BUPIVACAINE-EPINEPHRINE 0.25% -1:200000 IJ SOLN
INTRAMUSCULAR | Status: DC | PRN
  Administered 2020-02-17: 50 mL

## 2020-02-17 MED ORDER — PROPOFOL 10 MG/ML IV BOLUS
INTRAVENOUS | Status: DC | PRN
  Administered 2020-02-17: 100 mg via INTRAVENOUS

## 2020-02-17 MED ORDER — PHENYLEPHRINE 40 MCG/ML (10ML) SYRINGE FOR IV PUSH (FOR BLOOD PRESSURE SUPPORT)
PREFILLED_SYRINGE | INTRAVENOUS | Status: DC | PRN
  Administered 2020-02-17 (×5): 80 ug via INTRAVENOUS

## 2020-02-17 MED ORDER — ACETAMINOPHEN 325 MG PO TABS: 650.0000 mg | ORAL_TABLET | ORAL | Status: DC | PRN

## 2020-02-17 MED ORDER — FENTANYL CITRATE (PF) 100 MCG/2ML IJ SOLN
INTRAMUSCULAR | Status: DC | PRN
Start: 2020-02-17 — End: 2020-02-17
  Administered 2020-02-17: 50 ug via INTRAVENOUS
  Administered 2020-02-17: 100 ug via INTRAVENOUS

## 2020-02-17 MED ORDER — CHLORHEXIDINE GLUCONATE 0.12 % MT SOLN
15.0000 mL | Freq: Once | OROMUCOSAL | Status: AC
  Administered 2020-02-17: 15 mL via OROMUCOSAL

## 2020-02-17 MED ORDER — ONDANSETRON HCL 4 MG/2ML IJ SOLN
4.0000 mg | INTRAMUSCULAR | Status: DC | PRN
  Administered 2020-02-17: 4 mg via INTRAVENOUS
  Filled 2020-02-17: qty 2

## 2020-02-17 SURGICAL SUPPLY — 74 items
ADH SKN CLS APL DERMABOND .7 (GAUZE/BANDAGES/DRESSINGS) ×1
APL LAPSCP 35 DL APL RGD (MISCELLANEOUS) ×1
APL PRP STRL LF DISP 70% ISPRP (MISCELLANEOUS) ×1
APL SRG 32X5 SNPLK LF DISP (MISCELLANEOUS)
APPLICATOR VISTASEAL 35 (MISCELLANEOUS) ×2 IMPLANT
BAG SPEC RTRVL LRG 6X4 10 (ENDOMECHANICALS) ×1
CHLORAPREP W/TINT 26 (MISCELLANEOUS) ×2 IMPLANT
CLIP SUT LAPRA TY ABSORB (SUTURE) ×3 IMPLANT
CLIP VESOLOCK LG 6/CT PURPLE (CLIP) ×2 IMPLANT
CLIP VESOLOCK MED LG 6/CT (CLIP) ×5 IMPLANT
CLIP VESOLOCK XL 6/CT (CLIP) ×1 IMPLANT
COVER SURGICAL LIGHT HANDLE (MISCELLANEOUS) ×2 IMPLANT
COVER TIP SHEARS 8 DVNC (MISCELLANEOUS) ×1 IMPLANT
COVER TIP SHEARS 8MM DA VINCI (MISCELLANEOUS) ×2
COVER WAND RF STERILE (DRAPES) IMPLANT
CUTTER ECHEON FLEX ENDO 45 340 (ENDOMECHANICALS) IMPLANT
DECANTER SPIKE VIAL GLASS SM (MISCELLANEOUS) ×2 IMPLANT
DERMABOND ADVANCED (GAUZE/BANDAGES/DRESSINGS) ×1
DERMABOND ADVANCED .7 DNX12 (GAUZE/BANDAGES/DRESSINGS) ×1 IMPLANT
DRAIN CHANNEL 15F RND FF 3/16 (WOUND CARE) IMPLANT
DRAIN JP 10F RND SILICONE (MISCELLANEOUS) ×1 IMPLANT
DRAPE ARM DVNC X/XI (DISPOSABLE) ×4 IMPLANT
DRAPE COLUMN DVNC XI (DISPOSABLE) ×1 IMPLANT
DRAPE DA VINCI XI ARM (DISPOSABLE) ×8
DRAPE DA VINCI XI COLUMN (DISPOSABLE) ×2
DRAPE INCISE IOBAN 66X45 STRL (DRAPES) ×2 IMPLANT
DRAPE SHEET LG 3/4 BI-LAMINATE (DRAPES) ×2 IMPLANT
ELECT REM PT RETURN 15FT ADLT (MISCELLANEOUS) ×2 IMPLANT
EVACUATOR SILICONE 100CC (DRAIN) IMPLANT
GLOVE BIO SURGEON STRL SZ 6.5 (GLOVE) ×2 IMPLANT
GLOVE BIOGEL M STRL SZ7.5 (GLOVE) ×4 IMPLANT
GLOVE BIOGEL PI IND STRL 8 (GLOVE) ×2 IMPLANT
GLOVE BIOGEL PI INDICATOR 8 (GLOVE) ×2
GOWN STRL REUS W/TWL LRG LVL3 (GOWN DISPOSABLE) ×2 IMPLANT
GOWN STRL REUS W/TWL XL LVL3 (GOWN DISPOSABLE) ×4 IMPLANT
HEMOSTAT SURGICEL 4X8 (HEMOSTASIS) ×4 IMPLANT
IRRIG SUCT STRYKERFLOW 2 WTIP (MISCELLANEOUS) ×2
IRRIGATION SUCT STRKRFLW 2 WTP (MISCELLANEOUS) ×1 IMPLANT
KIT BASIN OR (CUSTOM PROCEDURE TRAY) ×2 IMPLANT
KIT TURNOVER KIT A (KITS) IMPLANT
MARKER SKIN DUAL TIP RULER LAB (MISCELLANEOUS) ×2 IMPLANT
NDL INSUFFLATION 14GA 120MM (NEEDLE) ×1 IMPLANT
NEEDLE INSUFFLATION 14GA 120MM (NEEDLE) ×2 IMPLANT
PENCIL SMOKE EVACUATOR (MISCELLANEOUS) IMPLANT
POUCH SPECIMEN RETRIEVAL 10MM (ENDOMECHANICALS) ×2 IMPLANT
PROTECTOR NERVE ULNAR (MISCELLANEOUS) ×4 IMPLANT
RELOAD STAPLE 45 2.6 WHT THIN (STAPLE) IMPLANT
SEAL CANN UNIV 5-8 DVNC XI (MISCELLANEOUS) ×3 IMPLANT
SEAL XI 5MM-8MM UNIVERSAL (MISCELLANEOUS) ×6
SEALANT SURGICAL APPL DUAL CAN (MISCELLANEOUS) IMPLANT
SET TUBE SMOKE EVAC HIGH FLOW (TUBING) ×2 IMPLANT
SOLUTION ELECTROLUBE (MISCELLANEOUS) ×2 IMPLANT
STAPLE RELOAD 45 WHT (STAPLE) IMPLANT
STAPLE RELOAD 45MM WHITE (STAPLE)
SUT ETHILON 2 0 PSLX (SUTURE) IMPLANT
SUT MNCRL AB 4-0 PS2 18 (SUTURE) ×4 IMPLANT
SUT PDS AB 0 CT1 36 (SUTURE) IMPLANT
SUT V-LOC BARB 180 2/0GR6 GS22 (SUTURE)
SUT VIC AB 1 CT1 27 (SUTURE) ×14
SUT VIC AB 1 CT1 27XBRD ANTBC (SUTURE) ×4 IMPLANT
SUT VIC AB 3-0 SH 27 (SUTURE) ×2
SUT VIC AB 3-0 SH 27XBRD (SUTURE) IMPLANT
SUT VICRYL 0 UR6 27IN ABS (SUTURE) IMPLANT
SUT VLOC BARB 180 ABS3/0GR12 (SUTURE) ×4
SUTURE V-LC BRB 180 2/0GR6GS22 (SUTURE) IMPLANT
SUTURE VLOC BRB 180 ABS3/0GR12 (SUTURE) ×2 IMPLANT
TOWEL OR 17X26 10 PK STRL BLUE (TOWEL DISPOSABLE) ×2 IMPLANT
TOWEL OR NON WOVEN STRL DISP B (DISPOSABLE) ×2 IMPLANT
TRAY FOLEY MTR SLVR 16FR STAT (SET/KITS/TRAYS/PACK) ×2 IMPLANT
TRAY LAPAROSCOPIC (CUSTOM PROCEDURE TRAY) ×2 IMPLANT
TROCAR BLADELESS OPT 5 100 (ENDOMECHANICALS) IMPLANT
TROCAR UNIVERSAL OPT 12M 100M (ENDOMECHANICALS) IMPLANT
TROCAR XCEL 12X100 BLDLESS (ENDOMECHANICALS) ×2 IMPLANT
WATER STERILE IRR 1000ML POUR (IV SOLUTION) ×2 IMPLANT

## 2020-02-17 NOTE — Anesthesia Postprocedure Evaluation (Signed)
Anesthesia Post Note  Patient: Elaine Harris  Procedure(s) Performed: XI ROBOTIC ASSITED LAPAROSCOPIC PARTIAL NEPHRECTOMY (Left Abdomen)     Patient location during evaluation: PACU Anesthesia Type: General Level of consciousness: awake and alert and oriented Pain management: pain level controlled Vital Signs Assessment: post-procedure vital signs reviewed and stable Respiratory status: spontaneous breathing, nonlabored ventilation and respiratory function stable Cardiovascular status: blood pressure returned to baseline and stable Postop Assessment: no apparent nausea or vomiting Anesthetic complications: no   No complications documented.  Last Vitals:  Vitals:   02/17/20 1715 02/17/20 1730  BP: (!) 112/54 (!) 101/53  Pulse: 68 68  Resp: 17 19  Temp:  (!) 36.3 C  SpO2: 95% 96%    Last Pain:  Vitals:   02/17/20 1730  TempSrc:   PainSc: 0-No pain                 Doss Cybulski A.

## 2020-02-17 NOTE — H&P (Signed)
PRE-OP H&P  Office Visit Report     02/09/2020   --------------------------------------------------------------------------------   Elaine Harris  MRN: [id number]  DOB: 1945-06-22, 74 year old Female   PRIMARY CARE:  Leeroy Cha, MD  REFERRING:  Dariyah Garduno A. Lovena Neighbours, MD  PROVIDER:  Ellison Hughs, M.D.  TREATING:  Jiles Crocker, NP  LOCATION:  [facility] [phone]     --------------------------------------------------------------------------------   CC/HPI: CC: Left renal mass   HPI: Elaine Harris is a 74 year old female with a history of kidney stones, chronic cystitis, OAB and a solid and enhancing 3.4 cm left renal mass.   05/16/19: The patient is here today for increased nocturia 3-6 times per night, lower abdominal pressure, frequency and urgency for the past 6 weeks. She has not passed any stone fragments or seen any gross hematuria. She denies fevers. She denies dysuria. She denies flank pain. She denies nausea and vomiting.   08/15/19: The patient is here today for routine follow-up. She notes persistent episodes of nocturia x 3-4. She is wearing 2 pads per day and has occasional episodes of urge incontinence. She denies any interval issues with constipation, interval UTIs, dysuria or hematuria. UA today is still concerning for possible cystitis.   09/26/19: The patient is here today for a routine follow-up after starting estradiol vaginal cream and oxybutynin. She reports marginal improvement in her urgency and frequency since starting oxybutynin. She states that she doesn't have to rush to the bathroom as quickly, but still has a strong urge to urinate with occasional episodes of leakage. Wearing 2-3 ppd. She notes that she can go ~2 hours between voids. Nocturia x 3. Denies any associated side effects with oxybutynin. UA today continues to show signs of cystitis. She denies dysuria, hematuria or suprapubic pain. Having semi solid BMs every other  day.   11/10/19: The patient is here today for a routine follow-up. Leakage is better with oxybutynin, but still wearing 1-2 ppd. Nocturia x 2. Denies interval UTIs, flank pain, dysuria or hematuria. UA today continues to show signs of cystitis despite daily keflex and vaginal estrogen. CTSS today shows a 1 cm left renal pelvis/UPJ stone along with a 5 mm lower pole stone and a 1.5 cm left parenchymal stone.   01/02/20: Patient with above-noted On evaluation at prior office visit, she was noted to have significant stone burden. She was also noted to have a solid enhancing left renal mass. She underwent left-sided ureteroscopy on 08/03 due to her significant stone burden and needed second-stage ureteroscopy to assure that her stone burden was clear, as she is likely going to need partial nephrectomy for enhancing mass. She underwent 2nd stage ureteroscopy on 08/11. Per review of operative note there are multiple stone fragments identified within the lower pole calyces. A laser was used to dust residual stone fragments. Complete inspection of left renal calices revealed no stone burden. She was left with a ureteral stent. She returns today for follow-up with flexible cystoscopy and left ureteral stent removal. Overall, she is recovering well postoperatively. She continues to experience left flank pain and dysuria associated with voiding. She intermittently notes increased frequency and urgency from her baseline. She also continues to have gross hematuria treat intermittently, but denies any passage of clot material. She denies fever, chills, nausea, or vomiting. She is not using antibiotics at this time. She is having some issues with constipation.   01/09/20: Elaine Harris is here today to discuss her upcoming left partial nephrectomy. She  has done reasonably well following her recent ureteroscopy is and subsequent stent removal. She denies interval episodes of flank pain, dysuria gross hematuria. She does report  some fatigue, but notes that it is slowly improving.   02/09/2020: Here today for preoperative appointment and left renal ultrasound. She is scheduled to undergo robotic left partial nephrectomy on 10/01 with her urologist. Since time of last office visit, she has had no recurrence of left-sided pain or discomfort. Denies interval stone material passage. Voiding at her baseline with stable, non bothersome symptomatology. She denies any interval burning or painful urination, visible blood in the urine, interval treatment for UTI. Furthermore she denies any recent changes to past medical history or prescription medications taken on a daily basis. No interval surgical procedures. She did have recent ureteroscopy last month and has recovered appropriately from this.     ALLERGIES: No Allergies    MEDICATIONS: Cephalexin 500 mg capsule 1 capsule PO BID As Directed Take twice daily for 7 days then transition to once daily until your next follow-up appointment  Ditropan Xl 10 mg tablet, extended release 24 hr 1 tablet PO Q HS  Tamsulosin Hcl 0.4 mg capsule TAKE 1 CAPSULE BY MOUTH EVERY DAY  Valium 10 mg tablet 1 tablet PO As Directed Take prior to your scheduled MRI  Calcium Citrate- Vitamin D 315 mg calcium-6.25 mcg (250 unit) tablet Oral  Estradiol 0.01 % cream with applicator Apply a pea-sized amnt around the urethra & vaginal opening once daily for 2 weeks, then transition to every other day.  Fish Oil CAPS Oral  Fluticasone Propionate 50 mcg/actuation spray, suspension  Lisinopril 20 mg tablet Oral  Multi-Day TABS Oral  Vaniqa 13.9 % cream  Vitamin D3 25 mcg (1,000 unit) tablet     GU PSH: Cysto Remove Stent FB Sim - 01/02/2020 Cystoscopy Insert Stent - 2011 ESWL - 2016, 2011 Hysterectomy Unilat SO - 2011 Ovary Removal Partial or Total - 2011 Ureteroscopic laser litho, Left - 12/28/2019, Left - 12/20/2019       PSH Notes: Lithotripsy, Lithotripsy, Cystoscopy With Insertion Of Ureteral Stent  Left, Oophorectomy, Shoulder Surgery, Hysterectomy, bilateral nephrolithotomy    NON-GU PSH: None   GU PMH: Left renal neoplasm - 01/02/2020 Chronic cystitis (w/o hematuria) - 09/26/2019 Acute Cystitis/UTI - 05/16/2019 Renal calculus, Nephrolithiasis - 2016 Other microscopic hematuria, Microscopic hematuria - 2016 Gross hematuria, Gross hematuria - 2014 Hydronephrosis Unspec, Hydronephrosis - 2014 Urge incontinence, Urge incontinence of urine - 2014      PMH Notes: Osteopenia 2015   NON-GU PMH: Encounter for general adult medical examination without abnormal findings, Encounter for preventive health examination - 2016 Osteopenia, Borderline osteopenia - 2015    FAMILY HISTORY: Acute Myocardial Infarction - Mother, Father Family Health Status Number - Runs In Family Father Deceased At Age63 ___ - Runs In Family Lung Cancer - Father Mother Deceased At Age 40 from diabetic complicati - Runs In Family   SOCIAL HISTORY: Marital Status: Married Preferred Language: English; Race: White Current Smoking Status: Patient has never smoked.   Tobacco Use Assessment Completed: Used Tobacco in last 30 days? Has never drank.  Drinks 1 caffeinated drink per day. Patient's occupation is/was Retired.    REVIEW OF SYSTEMS:    GU Review Female:   Patient denies frequent urination, hard to postpone urination, burning /pain with urination, get up at night to urinate, leakage of urine, stream starts and stops, trouble starting your stream, have to strain to urinate, and being pregnant.  Gastrointestinal (  Upper):   Patient denies nausea, vomiting, and indigestion/ heartburn.  Gastrointestinal (Lower):   Patient denies diarrhea and constipation.  Constitutional:   Patient denies fever, night sweats, weight loss, and fatigue.  Skin:   Patient denies skin rash/ lesion and itching.  Eyes:   Patient denies blurred vision and double vision.  Ears/ Nose/ Throat:   Patient denies sore throat and sinus  problems.  Hematologic/Lymphatic:   Patient denies swollen glands and easy bruising.  Cardiovascular:   Patient denies chest pains and leg swelling.  Respiratory:   Patient denies cough and shortness of breath.  Endocrine:   Patient denies excessive thirst.  Musculoskeletal:   Patient denies back pain and joint pain.  Neurological:   Patient denies headaches and dizziness.  Psychologic:   Patient denies depression and anxiety.   VITAL SIGNS:      02/09/2020 09:09 AM  Weight 188 lb / 85.28 kg  Height 63 in / 160.02 cm  BP 172/76 mmHg  Pulse 70 /min  BMI 33.3 kg/m   MULTI-SYSTEM PHYSICAL EXAMINATION:    Constitutional: Well-nourished. No physical deformities. Normally developed. Good grooming.  Neck: Neck symmetrical, not swollen. Normal tracheal position.  Respiratory: No labored breathing, no use of accessory muscles.   Cardiovascular: Normal temperature, normal extremity pulses, no swelling, no varicosities.  Skin: No paleness, no jaundice, no cyanosis. No lesion, no ulcer, no rash.  Neurologic / Psychiatric: Oriented to time, oriented to place, oriented to person. No depression, no anxiety, no agitation.  Gastrointestinal: No mass, no tenderness, no rigidity, non obese abdomen. No CVA or flank tenderness.  Musculoskeletal: Normal gait and station of head and neck.     Complexity of Data:  Source Of History:  Patient, Medical Record Summary  Records Review:   Previous Doctor Records, Previous Hospital Records, Previous Patient Records  Urine Test Review:   Urinalysis, Urine Culture  X-Ray Review: Renal Ultrasound (Limited): Reviewed Films. Discussed With Patient.     02/09/20  Urinalysis  Urine Appearance Slightly Cloudy   Urine Color Yellow   Urine Glucose Neg mg/dL  Urine Bilirubin Neg mg/dL  Urine Ketones Neg mg/dL  Urine Specific Gravity 1.025   Urine Blood Trace ery/uL  Urine pH 5.5   Urine Protein Trace mg/dL  Urine Urobilinogen 0.2 mg/dL  Urine Nitrites Neg    Urine Leukocyte Esterase 3+ leu/uL  Urine WBC/hpf >60/hpf   Urine RBC/hpf 0 - 2/hpf   Urine Epithelial Cells 0 - 5/hpf   Urine Bacteria Few (10-25/hpf)   Urine Mucous Present   Urine Yeast NS (Not Seen)   Urine Trichomonas Not Present   Urine Cystals NS (Not Seen)   Urine Casts NS (Not Seen)   Urine Sperm Not Present    PROCEDURES:         Renal Ultrasound (Limited) - [address]  Kidney: Left Length: 12.1 cm Depth: 5.5 cm Cortical Width: 1.0 cm Width: 4.6 cm    Left Kidney/Ureter:  Solid mass UP. Multiple non-obstructing calcifications  Bladder:  PVR 5.14 ml    CLINICAL DATA: Left kidney neoplasm.     EXAM:  MRI ABDOMEN WITHOUT AND WITH CONTRAST     TECHNIQUE:  Multiplanar multisequence MR imaging of the abdomen was performed  both before and after the administration of intravenous contrast.     CONTRAST: 19mL GADAVIST GADOBUTROL 1 MMOL/ML IV SOLN     COMPARISON: CT urogram 11/10/2019     FINDINGS:  Lower chest: Unremarkable.     Hepatobiliary: No suspicious  focal abnormality within the liver  parenchyma. Tiny foci of hyperenhancement scattered in the  subcapsular liver (images 28 and 50 of series 14) are too small to  characterize but likely benign. Heterogeneous signal intensity in  segment II is likely motion artifact. There is no evidence for  gallstones, gallbladder wall thickening, or pericholecystic fluid.  No intrahepatic or extrahepatic biliary dilation.     Pancreas: No focal mass lesion. No dilatation of the main duct. No  intraparenchymal cyst. No peripancreatic edema.     Spleen: No splenomegaly. Tiny cystic foci compatible with splenic  cysts or tiny pseudocyst.     Adrenals/Urinary Tract: Right adrenal gland unremarkable. 3.9 cm  myelolipoma noted left adrenal gland. Multiple Bosniak I and II  cysts are identified in the right kidney measuring up to 1.8 cm  towards the upper pole.     3.4 x 2.5 cm heterogeneously enhancing mass is identified in the   posterior aspect of the upper pole left kidney consistent with  neoplasm. This lesion does not extend into the central sinus fat.  Cystic lesion in the lateral interpolar left kidney measures up to  2.2 cm and represents the stone containing lesion seen on recent CT  urogram. Delayed imaging shows contrast in this lesion consistent  with caliceal diverticulum. Left renal pelvis stone seen on recent  CT scan is best demonstrated on coronal image 21 of series 2.     Stomach/Bowel: Stomach is unremarkable. No gastric wall thickening.  No evidence of outlet obstruction. Duodenum is normally positioned  as is the ligament of Treitz. No small bowel or colonic dilatation  within the visualized abdomen.     Vascular/Lymphatic: No abdominal aortic aneurysm. No abdominal  lymphadenopathy. No filling defect in the left renal vein.     Other: No intraperitoneal free fluid.     Musculoskeletal: No focal suspicious marrow enhancement within the  visualized bony anatomy.     IMPRESSION:  1. 3.4 x 2.5 cm heterogeneously enhancing mass in the posterior  aspect of the upper pole left kidney consistent with neoplasm. This  lesion does not extend into the central sinus fat. No filling defect  in the left renal vein.  2. 2.2 cm Bosniak I and II cysts in the right kidney.  3. Left renal stones including 1 in a caliceal diverticulum.  4. 3.9 cm left adrenal myelolipoma.        Electronically Signed  By: Misty Stanley M.D.  On: 12/05/2019 13:59  Patient confirmed No Neulasta OnPro Device.     ASSESSMENT:      ICD-10 Details  1 GU:   Left renal neoplasm - D49.512 Left, Chronic, Threat to Bodily Function  2   Renal calculus - N20.0 Chronic, Stable   PLAN:           Orders Labs Urine Culture          Schedule Return Visit/Planned Activity: Keep Scheduled Appointment - Follow up MD, Schedule Surgery          Document Letter(s):  Created for Patient: Clinical Summary         Notes:    Ultrasound today without obstructive signs. It does appear to be 1-2 stable appearing calcifications in the left lower pole that were not identified on recent ureteroscopy. Based on my independent review of ultrasound today calcifications do appear to be imbedded into the parenchyma. I will review with patient's urologist as well. She remains without bothersome lower urinary tract symptoms or pain/discomfort  suggestive of obstructive uropathy. Urinalysis with some pyuria today so I will send for precautionary/baseline urine culture especially given her past history of E coli UTI. If indicated appropriate antimicrobial therapy will be prescribed.    -I personally reviewed imaging results and films with the patient. We discussed that the mass in question has features concerning for malignancy. I explained the natural history of presumed renal cell carcinoma. I reviewed the AUA guidelines for evaluation and treatment of the small renal mass. The options of active surveillance, in situ tumor ablation, partial and radical nephrectomy was discussed. The risks of robotic LEFT partial nephrectomy were discussed in detail including but not limited to: negative pathology, open conversion, completion nephrectomy, infection of the urinary tract/skin/abdominal cavity, VTE, MI/CVA, lymphatic leak, injury to adjacent solid/hollow viscus organs, bleeding requiring a blood transfusion, catastrophic bleeding, hernia formation, need for postoperative angioembolization, urinary leak requiring stent/drain, and other imponderables.

## 2020-02-17 NOTE — Anesthesia Preprocedure Evaluation (Signed)
Anesthesia Evaluation  Patient identified by MRN, date of birth, ID band Patient awake    Reviewed: Allergy & Precautions, NPO status , Patient's Chart, lab work & pertinent test results  History of Anesthesia Complications (+) PONV and history of anesthetic complications  Airway Mallampati: II  TM Distance: >3 FB Neck ROM: Full    Dental no notable dental hx. (+) Teeth Intact, Caps   Pulmonary pneumonia, resolved,  Covid Test 3 days ago negative Husband tested positive for Covid yesterday- asymptomatic   Pulmonary exam normal breath sounds clear to auscultation       Cardiovascular hypertension, Pt. on medications Normal cardiovascular exam Rhythm:Regular Rate:Normal     Neuro/Psych negative neurological ROS  negative psych ROS   GI/Hepatic negative GI ROS, Neg liver ROS,   Endo/Other  Obesity  Renal/GU Left renal mass- probably Ca Hx/o left ureteral calculus   Urgency of urination    Musculoskeletal  (+) Arthritis , Osteoarthritis,    Abdominal (+) + obese,   Peds  Hematology negative hematology ROS (+)   Anesthesia Other Findings   Reproductive/Obstetrics                             Anesthesia Physical Anesthesia Plan  ASA: II  Anesthesia Plan: General   Post-op Pain Management:    Induction: Intravenous  PONV Risk Score and Plan: 4 or greater and Treatment may vary due to age or medical condition, Ondansetron and Dexamethasone  Airway Management Planned: Oral ETT  Additional Equipment: Arterial line  Intra-op Plan:   Post-operative Plan: Extubation in OR  Informed Consent: I have reviewed the patients History and Physical, chart, labs and discussed the procedure including the risks, benefits and alternatives for the proposed anesthesia with the patient or authorized representative who has indicated his/her understanding and acceptance.     Dental advisory  given  Plan Discussed with: CRNA and Anesthesiologist  Anesthesia Plan Comments: (Will retest patient for Covid. Discussed with OR staff.)        Anesthesia Quick Evaluation

## 2020-02-17 NOTE — Anesthesia Procedure Notes (Signed)
Procedure Name: Intubation Date/Time: 02/17/2020 1:05 PM Performed by: Gerald Leitz, CRNA Pre-anesthesia Checklist: Patient identified, Patient being monitored, Timeout performed, Emergency Drugs available and Suction available Patient Re-evaluated:Patient Re-evaluated prior to induction Oxygen Delivery Method: Circle system utilized Preoxygenation: Pre-oxygenation with 100% oxygen Induction Type: IV induction Ventilation: Mask ventilation without difficulty Laryngoscope Size: Mac and 3 Grade View: Grade I Tube type: Oral Tube size: 7.0 mm Number of attempts: 1 Placement Confirmation: ETT inserted through vocal cords under direct vision,  positive ETCO2 and breath sounds checked- equal and bilateral Secured at: 21 cm Tube secured with: Tape Dental Injury: Teeth and Oropharynx as per pre-operative assessment

## 2020-02-17 NOTE — Progress Notes (Signed)
Retesting patient for covid as her husband tested positive for covid on 02/14/20 when patient had tested negative. She has been vaccinated and states she has no symptoms.

## 2020-02-17 NOTE — Op Note (Signed)
Operative Note  Preoperative diagnosis:  1.  Left renal mass  Postoperative diagnosis: 1.  Left renal mass  Procedure(s): 1.  Robot-assisted laparoscopic left partial nephrectomy 2.  Intra-operative ultrasound of single retroperitoneal organ  Surgeon: Ellison Hughs, MD  Assistants:   1.  Debbrah Alar, [facility]  An assistant was required for this surgical procedure.  The duties of the assistant included but were not limited to suctioning, passing suture, camera manipulation, retraction.  This procedure would not be able to be performed without an Environmental consultant.   2.  Celene Squibb, MD PGY-4  Anesthesia:  General  Complications:  None  EBL:  230 mL  Specimens: 1. Left renal mass  Drains/Catheters: 1.  Left lower quadrant JP drain 2.  Foley Catheter  Intraoperative findings:   1. Intraoperative US showed a left upper renal mass with heterogenous echogenicity 2.   1.5 cm defect was made in the collecting system during the excision of the left renal mass that was primarily repaired intra-operatively 3.  The renorrhaphy was hemostatic at the conclusion of the case   Indication:  Elaine Harris is a 74 y.o. female with a solid and enhancing left renal mass with features concerning for RCC.  She has been consented for the above procedures, voices understanding and wishes to proceed.   Description of procedure:  After informed consent was obtained, the patient was brought to the operating room and general endotracheal anesthesia was administered.  The patient was then placed in the right lateral decubitus position and prepped and draped in usual sterile fashion.  A timeout was performed.  An 8 mm incision was then made lateral to the left rectus muscle at the level of the left 12th rib.  Abdominal access was obtained via a Veress needle.  The abdominal cavity was then insufflated up to 15 mmHg.  An 8 mm port was then introduced into the abdominal cavity.  Inspection of the port  entry site by the robotic camera revealed no adjacent organ injury.  We then placed 3 additional 8 mm robotic ports to triangulate the left renal hilum.  A 12 mm assistant port was then placed between the carmera port and 3rd robotic arm.  The white line of Toldt along the descending colon was incised sharply and the colon, along with its mesocolonic fat, was reflected medially until the aorta was identified.  We then made a small window adjacent to the lower pole of the left kidney, identifying the left psoas muscle, left ureter and left gonadal vein.  The left ureter and gonadal vein were then reflected anteriorly allowing [place] to then incised the perihilar attachments using electrocautery.  We encountered a small lumbar vein adjacent to the insertion of the left gonadal vein into the left renal vein.  This lumbar vein was ligated with hemo-lock clips in 2 places and incised sharply.  This provided [place] excellent exposure to the left renal hilum.  The perilymphatic tissue surrounding the left renal artery was carefully dissected away, creating a window to place a bulldog clamp later in the procedure.  The anterior portion of Gerota's fascia was incised, allowing reflection the perinephric fat medially and laterally until there was adequate exposure of the upper pole left renal mass.  Intraoperative ultrasound confirmed the heterogenous echogenicity of the lesion compared to the remainder of the renal parenchyma and allowed identification of the depth/borders of the mass, which were demarcated using electrocautery along the renal capsule.  We then exposed the left renal artery  and placed a bulldog clamp, marking warm ischemia time.  The left kidney immediately became ischemic and pale in appearance.  The left renal mass was then sharply excised with minimal blood loss.  After the mass was free, it was placed in the left upper quadrant to be retrieved later on during the operation.  There was a 1.5 cm defect in the  collecting system that was primary reapproximated with a running 3-0 Vicryl suture.   The renorrhaphy was then performed using a 3-0 V-lock in the deep layer of the renal parenchyma and then a series of interupted sutures were used to reapproximate the renal capsule using interupted 1-0 Vicryl suture along with hemo-lock clips act as a buttress against the renal capsule.  Once adequate tension was placed on the renorrhaphy sutures and the repair appeared hemostatic, we then removed the bulldog clamp marking the end of warm ischemia time at 12 minutes.  There did not appear to be any obvious bleeding around the renal hilum.  Two small arteries were seen bleeding after the bulldog clamp was removed.  These arteries were suture ligated and hemostasis was achieved. SurgiSeal and evicel were then applied to the left renal repair to aid in hemostasis.  The incised Gerota's fascia overlying the mass was then reapproximated using a running V lock suture.  The mass was then placed in an Endo Catch bag.  The robot was then de-docked and the camera was then reinserted into the assistant port. Laparoscopic graspers were then used to grab the string of the Endo Catch bag, which was brought out through the 12 mm assistant port.  The abdomen was then desufflated and all ports were removed.  The assistant port incision was then extended approximately 1 cm and the left renal mass, within the Endo Catch bag, was removed and sent to pathology for permanent section.  The fascia within the assistant port incision was then reapproximated using a 0 Vicryl suture.  The remainder of the incisions were then closed using 4-0 Monocryl and dressed appropriately.  Patient tolerated the procedure well and was transferred to the postanesthesia unit in stable condition.  Plan:  Bedrest overnight.  Monitor JP output.  ADAT.

## 2020-02-17 NOTE — Discharge Instructions (Signed)

## 2020-02-17 NOTE — Transfer of Care (Signed)
Immediate Anesthesia Transfer of Care Note  Patient: Elaine Harris  Procedure(s) Performed: Procedure(s): XI ROBOTIC ASSITED LAPAROSCOPIC PARTIAL NEPHRECTOMY (Left)  Patient Location: PACU  Anesthesia Type:General  Level of Consciousness: Alert, Awake, Oriented  Airway & Oxygen Therapy: Patient Spontanous Breathing  Post-op Assessment: Report given to RN  Post vital signs: Reviewed and stable  Last Vitals:  Vitals:   02/17/20 1030  BP: (!) 183/87  Pulse: 98  Resp: 18  Temp: 36.8 C  SpO2: 112%    Complications: No apparent anesthesia complications

## 2020-02-18 ENCOUNTER — Encounter (HOSPITAL_COMMUNITY): Payer: Self-pay | Admitting: Urology

## 2020-02-18 ENCOUNTER — Other Ambulatory Visit: Payer: Self-pay

## 2020-02-18 DIAGNOSIS — C642 Malignant neoplasm of left kidney, except renal pelvis: Secondary | ICD-10-CM | POA: Diagnosis not present

## 2020-02-18 DIAGNOSIS — Z87442 Personal history of urinary calculi: Secondary | ICD-10-CM | POA: Diagnosis not present

## 2020-02-18 DIAGNOSIS — Z833 Family history of diabetes mellitus: Secondary | ICD-10-CM | POA: Diagnosis not present

## 2020-02-18 DIAGNOSIS — Z20822 Contact with and (suspected) exposure to covid-19: Secondary | ICD-10-CM | POA: Diagnosis not present

## 2020-02-18 DIAGNOSIS — Z6833 Body mass index (BMI) 33.0-33.9, adult: Secondary | ICD-10-CM | POA: Diagnosis not present

## 2020-02-18 DIAGNOSIS — Z8249 Family history of ischemic heart disease and other diseases of the circulatory system: Secondary | ICD-10-CM | POA: Diagnosis not present

## 2020-02-18 DIAGNOSIS — Z801 Family history of malignant neoplasm of trachea, bronchus and lung: Secondary | ICD-10-CM | POA: Diagnosis not present

## 2020-02-18 DIAGNOSIS — I10 Essential (primary) hypertension: Secondary | ICD-10-CM | POA: Diagnosis not present

## 2020-02-18 DIAGNOSIS — E669 Obesity, unspecified: Secondary | ICD-10-CM | POA: Diagnosis not present

## 2020-02-18 DIAGNOSIS — Z87440 Personal history of urinary (tract) infections: Secondary | ICD-10-CM | POA: Diagnosis not present

## 2020-02-18 LAB — BASIC METABOLIC PANEL
Anion gap: 7 (ref 5–15)
BUN: 17 mg/dL (ref 8–23)
CO2: 24 mmol/L (ref 22–32)
Calcium: 7.9 mg/dL — ABNORMAL LOW (ref 8.9–10.3)
Chloride: 103 mmol/L (ref 98–111)
Creatinine, Ser: 0.94 mg/dL (ref 0.44–1.00)
GFR calc Af Amer: >60 mL/min (ref >60)
GFR calc non Af Amer: 60 mL/min — ABNORMAL LOW (ref >60)
Glucose, Bld: 215 mg/dL — ABNORMAL HIGH (ref 70–99)
Potassium: 4 mmol/L (ref 3.5–5.1)
Sodium: 134 mmol/L — ABNORMAL LOW (ref 135–145)

## 2020-02-18 LAB — HEMOGLOBIN AND HEMATOCRIT, BLOOD
HCT: 32.5 % — ABNORMAL LOW (ref 36.0–46.0)
Hemoglobin: 10.4 g/dL — ABNORMAL LOW (ref 12.0–15.0)

## 2020-02-18 LAB — CREATININE, FLUID (PLEURAL, PERITONEAL, JP DRAINAGE): Creat, Fluid: 0.9 mg/dL

## 2020-02-18 MED ORDER — CHLORHEXIDINE GLUCONATE CLOTH 2 % EX PADS: 6.0000 | MEDICATED_PAD | Freq: Every day | CUTANEOUS | Status: DC

## 2020-02-18 NOTE — Progress Notes (Signed)
Urology Inpatient Progress Report  Renal mass [N28.89]  Procedure(s): XI ROBOTIC ASSITED LAPAROSCOPIC PARTIAL NEPHRECTOMY  1 Day Post-Op   Intv/Subj: No acute events overnight.  Patient has mild reflux symptoms.  She was nauseated last night which as resolved.  +Flatus. Has not gotten OOB yet.    Active Problems:   Renal mass  Current Facility-Administered Medications  Medication Dose Route Frequency Provider Last Rate Last Admin  . acetaminophen (TYLENOL) tablet 650 mg  650 mg Oral Q4H PRN Dancy, Amanda, PA-C      . ceFAZolin (ANCEF) 1-4 GM/50ML-% IVPB           . Chlorhexidine Gluconate Cloth 2 % PADS 6 each  6 each Topical Daily Winter, Christopher Aaron, MD      . dextrose 5 %-0.45 % sodium chloride infusion   Intravenous Continuous Debbrah Alar, PA-C 100 mL/hr at 02/18/20 0423 New Bag at 02/18/20 0423  . diphenhydrAMINE (BENADRYL) injection 12.5 mg  12.5 mg Intravenous Q6H PRN Dancy, Amanda, PA-C       Or  . diphenhydrAMINE (BENADRYL) 12.5 MG/5ML elixir 12.5 mg  12.5 mg Oral Q6H PRN Dancy, Amanda, PA-C      . docusate sodium (COLACE) capsule 100 mg  100 mg Oral BID Debbrah Alar, PA-C      . HYDROmorphone (DILAUDID) 1 MG/ML injection           . HYDROmorphone (DILAUDID) injection 0.5-1 mg  0.5-1 mg Intravenous Q2H PRN Debbrah Alar, PA-C   0.5 mg at 02/17/20 2145  . ondansetron (ZOFRAN) 4 MG/2ML injection           . ondansetron (ZOFRAN) injection 4 mg  4 mg Intravenous Q4H PRN Debbrah Alar, PA-C   4 mg at 02/17/20 2236  . opium-belladonna (B&O) suppository 16.2-60mg   1 suppository Rectal Q6H PRN Debbrah Alar, PA-C      . oxyCODONE (Oxy IR/ROXICODONE) immediate release tablet 5 mg  5 mg Oral Q4H PRN Debbrah Alar, PA-C   5 mg at 02/18/20 0419     Objective: Vital: Vitals:   02/17/20 2140 02/17/20 2217 02/18/20 0216 02/18/20 0543  BP: (!) 96/55 (!) 103/57 (!) 111/52 (!) 109/52  Pulse: 85 81 72 72  Resp: (!) 23 16 16 16   Temp: (!) 97.5 F (36.4 C) (!) 97.5 F (36.4  C) 97.6 F (36.4 C) 98.1 F (36.7 C)  TempSrc:  Oral Oral Oral  SpO2: 97% 99% 100% 99%  Weight:  85.8 kg    Height:  5\' 4"  (1.626 m)     I/Os: I/O last 3 completed shifts: In: 2751 [P.O.:500; I.V.:3290; IV Piggyback:200] Out: 1055 [Urine:470; Drains:355; Blood:230]  Physical Exam:  General: Patient is in no apparent distress Lungs: Normal respiratory effort, chest expands symmetrically. GI: Incisions are c/d/i. The abdomen is soft and nontender without mass. JP drain with bloody/ss drainage (130/225=355 postop) Foley: draining clear urine Ext: lower extremities symmetric, SCDs on and working  Lab Results: Recent Labs    02/17/20 1706  HGB 12.2  HCT 38.1   No results for input(s): NA, K, CL, CO2, GLUCOSE, BUN, CREATININE, CALCIUM in the last 72 hours. No results for input(s): LABPT, INR in the last 72 hours. No results for input(s): LABURIN in the last 72 hours. Results for orders placed or performed during the hospital encounter of 02/17/20  Respiratory Panel by RT PCR (Flu A&B, Covid) - Nasopharyngeal Swab     Status: None   Collection Time: 02/17/20 11:23 AM   Specimen: Nasopharyngeal Swab  Result Value Ref Range Status   SARS Coronavirus 2 by RT PCR NEGATIVE NEGATIVE Final    Comment: (NOTE) SARS-CoV-2 target nucleic acids are NOT DETECTED.  The SARS-CoV-2 RNA is generally detectable in upper respiratoy specimens during the acute phase of infection. The lowest concentration of SARS-CoV-2 viral copies this assay can detect is 131 copies/mL. A negative result does not preclude SARS-Cov-2 infection and should not be used as the sole basis for treatment or other patient management decisions. A negative result may occur with  improper specimen collection/handling, submission of specimen other than nasopharyngeal swab, presence of viral mutation(s) within the areas targeted by this assay, and inadequate number of viral copies (<131 copies/mL). A negative result must be  combined with clinical observations, patient history, and epidemiological information. The expected result is Negative.  Fact Sheet for Patients:  PinkCheek.be  Fact Sheet for Healthcare Providers:  GravelBags.it  This test is no t yet approved or cleared by the [place] FDA and  has been authorized for detection and/or diagnosis of SARS-CoV-2 by FDA under an Emergency Use Authorization (EUA). This EUA will remain  in effect (meaning this test can be used) for the duration of the COVID-19 declaration under Section 564(b)(1) of the Act, 21 U.S.C. section 360bbb-3(b)(1), unless the authorization is terminated or revoked sooner.     Influenza A by PCR NEGATIVE NEGATIVE Final   Influenza B by PCR NEGATIVE NEGATIVE Final    Comment: (NOTE) The Xpert Xpress SARS-CoV-2/FLU/RSV assay is intended as an aid in  the diagnosis of influenza from Nasopharyngeal swab specimens and  should not be used as a sole basis for treatment. Nasal washings and  aspirates are unacceptable for Xpert Xpress SARS-CoV-2/FLU/RSV  testing.  Fact Sheet for Patients: PinkCheek.be  Fact Sheet for Healthcare Providers: GravelBags.it  This test is not yet approved or cleared by the [place] FDA and  has been authorized for detection and/or diagnosis of SARS-CoV-2 by  FDA under an Emergency Use Authorization (EUA). This EUA will remain  in effect (meaning this test can be used) for the duration of the  Covid-19 declaration under Section 564(b)(1) of the Act, 21  U.S.C. section 360bbb-3(b)(1), unless the authorization is  terminated or revoked. Performed at [facility], [place] [address], [place], [place] [address]     Studies/Results: No results found.  Assessment: 74 year old woman with a left renal mass postop day 1 status post a left robotic partial nephrectomy  doing well.   Plan: -send JP drain for creatinine -d/c foley -DVT ppx: OOB and SCDs at all times while in bed -continue clears and advance as tolerated -AM labs pending   Jacalyn Lefevre, MD Urology 02/18/2020, 7:03 AM

## 2020-02-19 DIAGNOSIS — I10 Essential (primary) hypertension: Secondary | ICD-10-CM | POA: Diagnosis present

## 2020-02-19 DIAGNOSIS — C642 Malignant neoplasm of left kidney, except renal pelvis: Secondary | ICD-10-CM | POA: Diagnosis present

## 2020-02-19 DIAGNOSIS — N2889 Other specified disorders of kidney and ureter: Secondary | ICD-10-CM | POA: Diagnosis present

## 2020-02-19 DIAGNOSIS — Z8249 Family history of ischemic heart disease and other diseases of the circulatory system: Secondary | ICD-10-CM | POA: Diagnosis not present

## 2020-02-19 DIAGNOSIS — Z833 Family history of diabetes mellitus: Secondary | ICD-10-CM | POA: Diagnosis not present

## 2020-02-19 DIAGNOSIS — Z87440 Personal history of urinary (tract) infections: Secondary | ICD-10-CM | POA: Diagnosis not present

## 2020-02-19 DIAGNOSIS — Z6833 Body mass index (BMI) 33.0-33.9, adult: Secondary | ICD-10-CM | POA: Diagnosis not present

## 2020-02-19 DIAGNOSIS — E669 Obesity, unspecified: Secondary | ICD-10-CM | POA: Diagnosis present

## 2020-02-19 DIAGNOSIS — D49512 Neoplasm of unspecified behavior of left kidney: Secondary | ICD-10-CM | POA: Diagnosis not present

## 2020-02-19 DIAGNOSIS — Z801 Family history of malignant neoplasm of trachea, bronchus and lung: Secondary | ICD-10-CM | POA: Diagnosis not present

## 2020-02-19 DIAGNOSIS — Z87442 Personal history of urinary calculi: Secondary | ICD-10-CM | POA: Diagnosis not present

## 2020-02-19 DIAGNOSIS — Z20822 Contact with and (suspected) exposure to covid-19: Secondary | ICD-10-CM | POA: Diagnosis present

## 2020-02-19 LAB — BASIC METABOLIC PANEL
Anion gap: 8 (ref 5–15)
BUN: 11 mg/dL (ref 8–23)
CO2: 24 mmol/L (ref 22–32)
Calcium: 8.1 mg/dL — ABNORMAL LOW (ref 8.9–10.3)
Chloride: 106 mmol/L (ref 98–111)
Creatinine, Ser: 0.84 mg/dL (ref 0.44–1.00)
GFR calc Af Amer: >60 mL/min (ref >60)
GFR calc non Af Amer: >60 mL/min (ref >60)
Glucose, Bld: 135 mg/dL — ABNORMAL HIGH (ref 70–99)
Potassium: 4.1 mmol/L (ref 3.5–5.1)
Sodium: 138 mmol/L (ref 135–145)

## 2020-02-19 LAB — HEMOGLOBIN AND HEMATOCRIT, BLOOD
HCT: 26.8 % — ABNORMAL LOW (ref 36.0–46.0)
HCT: 28.4 % — ABNORMAL LOW (ref 36.0–46.0)
Hemoglobin: 8.4 g/dL — ABNORMAL LOW (ref 12.0–15.0)
Hemoglobin: 8.8 g/dL — ABNORMAL LOW (ref 12.0–15.0)

## 2020-02-19 NOTE — Progress Notes (Signed)
Patient IVs removed before discharge. Patient given discharge instructions and educated on care of surgical site incisions as well as drain incision and medications.  Patient left wearing personal clothing and had 3 bags with her.  Her son was present during discharge to hear discharge instructions. Patient was taken to main entrance in wheelchair to personal vehicle.

## 2020-02-19 NOTE — Progress Notes (Signed)
Urology Inpatient Progress Report   Intv/Subj: No acute events overnight.  Patient is without complaint.  She has ambulated in hall.  Foley removed yesterday.  +Flatus and tolerating clears.   Active Problems:   Renal mass   Right renal mass  Current Facility-Administered Medications  Medication Dose Route Frequency Provider Last Rate Last Admin  . acetaminophen (TYLENOL) tablet 650 mg  650 mg Oral Q4H PRN Debbrah Alar, PA-C      . Chlorhexidine Gluconate Cloth 2 % PADS 6 each  6 each Topical Daily Winter, Christopher Aaron, MD      . dextrose 5 %-0.45 % sodium chloride infusion   Intravenous Continuous Debbrah Alar, PA-C 100 mL/hr at 02/18/20 2233 New Bag at 02/18/20 2233  . diphenhydrAMINE (BENADRYL) injection 12.5 mg  12.5 mg Intravenous Q6H PRN Dancy, Amanda, PA-C       Or  . diphenhydrAMINE (BENADRYL) 12.5 MG/5ML elixir 12.5 mg  12.5 mg Oral Q6H PRN Dancy, Amanda, PA-C      . docusate sodium (COLACE) capsule 100 mg  100 mg Oral BID Debbrah Alar, PA-C   100 mg at 02/18/20 2128  . HYDROmorphone (DILAUDID) injection 0.5-1 mg  0.5-1 mg Intravenous Q2H PRN Debbrah Alar, PA-C   0.5 mg at 02/17/20 2145  . ondansetron (ZOFRAN) injection 4 mg  4 mg Intravenous Q4H PRN Debbrah Alar, PA-C   4 mg at 02/17/20 2236  . opium-belladonna (B&O) suppository 16.2-60mg   1 suppository Rectal Q6H PRN Debbrah Alar, PA-C      . oxyCODONE (Oxy IR/ROXICODONE) immediate release tablet 5 mg  5 mg Oral Q4H PRN Debbrah Alar, PA-C   5 mg at 02/18/20 2128     Objective: Vital: Vitals:   02/18/20 1758 02/18/20 2010 02/18/20 2128 02/19/20 0626  BP: (!) 107/54 (!) 99/42 128/60 (!) 113/46  Pulse: 82 75 72 82  Resp: 18 18  18   Temp: 99.7 F (37.6 C) 98.5 F (36.9 C)  98.5 F (36.9 C)  TempSrc: Oral Oral    SpO2: 95% 94%  100%  Weight:      Height:       I/Os: I/O last 3 completed shifts: In: 5055 [P.O.:1580; I.V.:3375; IV Piggyback:100] Out: 2675 [Urine:2220; Drains:455]  Physical Exam:   General: Patient is in no apparent distress Lungs: Normal respiratory effort, chest expands symmetrically. GI: Incisions are c/d/i. The abdomen is soft and nontender without mass. JP drain with serosanguinous drainage (JP Cr consistent with serum: 150/80) removed today Foley: removed yesterday Ext: lower extremities symmetric  Lab Results: Recent Labs    02/17/20 1706 02/18/20 0619 02/19/20 0609  HGB 12.2 10.4* 8.4*  HCT 38.1 32.5* 26.8*   Recent Labs    02/18/20 0619 02/19/20 0609  NA 134* 138  K 4.0 4.1  CL 103 106  CO2 24 24  GLUCOSE 215* 135*  BUN 17 11  CREATININE 0.94 0.84  CALCIUM 7.9* 8.1*   No results for input(s): LABPT, INR in the last 72 hours. No results for input(s): LABURIN in the last 72 hours. Results for orders placed or performed during the hospital encounter of 02/17/20  Respiratory Panel by RT PCR (Flu A&B, Covid) - Nasopharyngeal Swab     Status: None   Collection Time: 02/17/20 11:23 AM   Specimen: Nasopharyngeal Swab  Result Value Ref Range Status   SARS Coronavirus 2 by RT PCR NEGATIVE NEGATIVE Final    Comment: (NOTE) SARS-CoV-2 target nucleic acids are NOT DETECTED.  The SARS-CoV-2 RNA is generally detectable  in upper respiratoy specimens during the acute phase of infection. The lowest concentration of SARS-CoV-2 viral copies this assay can detect is 131 copies/mL. A negative result does not preclude SARS-Cov-2 infection and should not be used as the sole basis for treatment or other patient management decisions. A negative result may occur with  improper specimen collection/handling, submission of specimen other than nasopharyngeal swab, presence of viral mutation(s) within the areas targeted by this assay, and inadequate number of viral copies (<131 copies/mL). A negative result must be combined with clinical observations, patient history, and epidemiological information. The expected result is Negative.  Fact Sheet for Patients:   PinkCheek.be  Fact Sheet for Healthcare Providers:  GravelBags.it  This test is no t yet approved or cleared by the [place] FDA and  has been authorized for detection and/or diagnosis of SARS-CoV-2 by FDA under an Emergency Use Authorization (EUA). This EUA will remain  in effect (meaning this test can be used) for the duration of the COVID-19 declaration under Section 564(b)(1) of the Act, 21 U.S.C. section 360bbb-3(b)(1), unless the authorization is terminated or revoked sooner.     Influenza A by PCR NEGATIVE NEGATIVE Final   Influenza B by PCR NEGATIVE NEGATIVE Final    Comment: (NOTE) The Xpert Xpress SARS-CoV-2/FLU/RSV assay is intended as an aid in  the diagnosis of influenza from Nasopharyngeal swab specimens and  should not be used as a sole basis for treatment. Nasal washings and  aspirates are unacceptable for Xpert Xpress SARS-CoV-2/FLU/RSV  testing.  Fact Sheet for Patients: PinkCheek.be  Fact Sheet for Healthcare Providers: GravelBags.it  This test is not yet approved or cleared by the [place] FDA and  has been authorized for detection and/or diagnosis of SARS-CoV-2 by  FDA under an Emergency Use Authorization (EUA). This EUA will remain  in effect (meaning this test can be used) for the duration of the  Covid-19 declaration under Section 564(b)(1) of the Act, 21  U.S.C. section 360bbb-3(b)(1), unless the authorization is  terminated or revoked. Performed at [facility], [place] [address], [place], [place] [address]     Studies/Results: No results found.  Assessment: 74 year old woman with a left renal mass postop day 2 status post a left robotic partial nephrectomy doing well. Cr stable.  JP and foley removed.   Plan: -JP removed today -DVT ppx: OOB and SCDs at all times while in bed -advance to regular  diet -pain meds PRN -anticipated dc this afternoon -saline lock IVF -repeat Hgb at 1pm, if stable will dc; if continues to decline will keep for observation    Jacalyn Lefevre, MD Urology 02/19/2020, 8:04 AM

## 2020-02-19 NOTE — Discharge Summary (Signed)
Date of admission: 02/17/2020  Date of discharge: 02/19/2020  Admission diagnosis: left renal mass  Discharge diagnosis: left renal mass  Secondary diagnoses:  Patient Active Problem List   Diagnosis Date Noted  . Right renal mass 02/19/2020  . Left renal mass 02/19/2020  . Renal mass 02/17/2020    Procedures performed: Procedure(s): XI ROBOTIC ASSITED LAPAROSCOPIC PARTIAL NEPHRECTOMY  History and Physical: For full details, please see admission history and physical. Briefly, Alga Southall Mccallum is a 74 y.o. year old patient with left renal mass.   Hospital Course: Patient tolerated the procedure well.  She was then transferred to the floor after an uneventful PACU stay.  Her hospital course was uncomplicated.  On POD#2 she had met discharge criteria: was eating a regular diet, was up and ambulating independently,  pain was well controlled, was voiding without a catheter, and was ready to for discharge.   Laboratory values:  Recent Labs    02/18/20 0619 02/19/20 0609 02/19/20 1259  HGB 10.4* 8.4* 8.8*  HCT 32.5* 26.8* 28.4*   Recent Labs    02/18/20 0619 02/19/20 0609  NA 134* 138  K 4.0 4.1  CL 103 106  CO2 24 24  GLUCOSE 215* 135*  BUN 17 11  CREATININE 0.94 0.84  CALCIUM 7.9* 8.1*   No results for input(s): LABPT, INR in the last 72 hours. No results for input(s): LABURIN in the last 72 hours. Results for orders placed or performed during the hospital encounter of 02/17/20  Respiratory Panel by RT PCR (Flu A&B, Covid) - Nasopharyngeal Swab     Status: None   Collection Time: 02/17/20 11:23 AM   Specimen: Nasopharyngeal Swab  Result Value Ref Range Status   SARS Coronavirus 2 by RT PCR NEGATIVE NEGATIVE Final    Comment: (NOTE) SARS-CoV-2 target nucleic acids are NOT DETECTED.  The SARS-CoV-2 RNA is generally detectable in upper respiratoy specimens during the acute phase of infection. The lowest concentration of SARS-CoV-2 viral copies this assay can detect  is 131 copies/mL. A negative result does not preclude SARS-Cov-2 infection and should not be used as the sole basis for treatment or other patient management decisions. A negative result may occur with  improper specimen collection/handling, submission of specimen other than nasopharyngeal swab, presence of viral mutation(s) within the areas targeted by this assay, and inadequate number of viral copies (<131 copies/mL). A negative result must be combined with clinical observations, patient history, and epidemiological information. The expected result is Negative.  Fact Sheet for Patients:  PinkCheek.be  Fact Sheet for Healthcare Providers:  GravelBags.it  This test is no t yet approved or cleared by the [place] FDA and  has been authorized for detection and/or diagnosis of SARS-CoV-2 by FDA under an Emergency Use Authorization (EUA). This EUA will remain  in effect (meaning this test can be used) for the duration of the COVID-19 declaration under Section 564(b)(1) of the Act, 21 U.S.C. section 360bbb-3(b)(1), unless the authorization is terminated or revoked sooner.     Influenza A by PCR NEGATIVE NEGATIVE Final   Influenza B by PCR NEGATIVE NEGATIVE Final    Comment: (NOTE) The Xpert Xpress SARS-CoV-2/FLU/RSV assay is intended as an aid in  the diagnosis of influenza from Nasopharyngeal swab specimens and  should not be used as a sole basis for treatment. Nasal washings and  aspirates are unacceptable for Xpert Xpress SARS-CoV-2/FLU/RSV  testing.  Fact Sheet for Patients: PinkCheek.be  Fact Sheet for Healthcare Providers: GravelBags.it  This test  is not yet approved or cleared by the [place] and  has been authorized for detection and/or diagnosis of SARS-CoV-2 by  FDA under an Emergency Use Authorization (EUA). This EUA will remain  in effect  (meaning this test can be used) for the duration of the  Covid-19 declaration under Section 564(b)(1) of the Act, 21  U.S.C. section 360bbb-3(b)(1), unless the authorization is  terminated or revoked. Performed at [facility], [place] [address], [place], [place] [address]     Disposition: Home  Discharge instruction: The patient was instructed to be ambulatory but told to refrain from heavy lifting, strenuous activity, or driving.  Discharge medications:  Allergies as of 02/19/2020      Reactions   Other    Pollen-runny nose and cough      Medication List    STOP taking these medications   CRANBERRY EXTRACT PO   ketorolac 10 MG tablet Commonly known as: TORADOL   oxybutynin 10 MG 24 hr tablet Commonly known as: DITROPAN-XL   PROBIOTIC PO   tamsulosin 0.4 MG Caps capsule Commonly known as: FLOMAX     TAKE these medications   cephALEXin 500 MG capsule Commonly known as: KEFLEX Take 500 mg by mouth at bedtime.   docusate sodium 100 MG capsule Commonly known as: COLACE Take 1 capsule (100 mg total) by mouth 2 (two) times daily.   estradiol 0.1 MG/GM vaginal cream Commonly known as: ESTRACE Place 1 Applicatorful vaginally every other day.   HYDROcodone-acetaminophen 5-325 MG tablet Commonly known as: Norco Take 1-2 tablets by mouth every 6 (six) hours as needed for moderate pain. What changed:   how much to take  when to take this   lisinopril 20 MG tablet Commonly known as: ZESTRIL Take 10 mg by mouth daily.   ondansetron 4 MG tablet Commonly known as: Zofran Take 1 tablet (4 mg total) by mouth daily as needed for nausea or vomiting.   phenazopyridine 200 MG tablet Commonly known as: Pyridium Take 1 tablet (200 mg total) by mouth 3 (three) times daily as needed (for pain with urination).       Followup:   Follow-up Information    Ceasar Mons, MD On 03/02/2020.   Specialty: Urology Why: at 10:15 Contact  information: [place] [address] [phone]

## 2020-02-21 LAB — SURGICAL PATHOLOGY

## 2020-02-27 ENCOUNTER — Other Ambulatory Visit: Payer: Medicare Other

## 2020-02-27 DIAGNOSIS — Z20822 Contact with and (suspected) exposure to covid-19: Secondary | ICD-10-CM

## 2020-02-28 LAB — NOVEL CORONAVIRUS, NAA: SARS-CoV-2, NAA: NOT DETECTED

## 2020-02-28 LAB — SARS-COV-2, NAA 2 DAY TAT

## 2020-03-02 DIAGNOSIS — Z85528 Personal history of other malignant neoplasm of kidney: Secondary | ICD-10-CM | POA: Diagnosis not present

## 2020-03-02 DIAGNOSIS — R8279 Other abnormal findings on microbiological examination of urine: Secondary | ICD-10-CM | POA: Diagnosis not present

## 2020-03-02 DIAGNOSIS — C642 Malignant neoplasm of left kidney, except renal pelvis: Secondary | ICD-10-CM | POA: Diagnosis not present

## 2020-04-02 DIAGNOSIS — D49512 Neoplasm of unspecified behavior of left kidney: Secondary | ICD-10-CM | POA: Diagnosis not present

## 2020-04-02 DIAGNOSIS — Z85528 Personal history of other malignant neoplasm of kidney: Secondary | ICD-10-CM | POA: Diagnosis not present

## 2020-04-02 DIAGNOSIS — R8279 Other abnormal findings on microbiological examination of urine: Secondary | ICD-10-CM | POA: Diagnosis not present

## 2020-04-05 DIAGNOSIS — Z7189 Other specified counseling: Secondary | ICD-10-CM | POA: Diagnosis not present

## 2020-04-05 DIAGNOSIS — R7309 Other abnormal glucose: Secondary | ICD-10-CM | POA: Diagnosis not present

## 2020-04-05 DIAGNOSIS — Z0000 Encounter for general adult medical examination without abnormal findings: Secondary | ICD-10-CM | POA: Diagnosis not present

## 2020-04-05 DIAGNOSIS — E669 Obesity, unspecified: Secondary | ICD-10-CM | POA: Diagnosis not present

## 2020-04-05 DIAGNOSIS — Z1389 Encounter for screening for other disorder: Secondary | ICD-10-CM | POA: Diagnosis not present

## 2020-04-05 DIAGNOSIS — Z23 Encounter for immunization: Secondary | ICD-10-CM | POA: Diagnosis not present

## 2020-04-05 DIAGNOSIS — J301 Allergic rhinitis due to pollen: Secondary | ICD-10-CM | POA: Diagnosis not present

## 2020-04-05 DIAGNOSIS — I10 Essential (primary) hypertension: Secondary | ICD-10-CM | POA: Diagnosis not present

## 2020-04-05 DIAGNOSIS — E785 Hyperlipidemia, unspecified: Secondary | ICD-10-CM | POA: Diagnosis not present

## 2020-04-05 DIAGNOSIS — C642 Malignant neoplasm of left kidney, except renal pelvis: Secondary | ICD-10-CM | POA: Diagnosis not present

## 2020-05-29 ENCOUNTER — Other Ambulatory Visit: Payer: Self-pay | Admitting: Internal Medicine

## 2020-05-29 DIAGNOSIS — Z1231 Encounter for screening mammogram for malignant neoplasm of breast: Secondary | ICD-10-CM

## 2020-06-25 DIAGNOSIS — N200 Calculus of kidney: Secondary | ICD-10-CM | POA: Diagnosis not present

## 2020-06-25 DIAGNOSIS — Z85528 Personal history of other malignant neoplasm of kidney: Secondary | ICD-10-CM | POA: Diagnosis not present

## 2020-06-25 DIAGNOSIS — K449 Diaphragmatic hernia without obstruction or gangrene: Secondary | ICD-10-CM | POA: Diagnosis not present

## 2020-06-25 DIAGNOSIS — D3502 Benign neoplasm of left adrenal gland: Secondary | ICD-10-CM | POA: Diagnosis not present

## 2020-06-25 DIAGNOSIS — C642 Malignant neoplasm of left kidney, except renal pelvis: Secondary | ICD-10-CM | POA: Diagnosis not present

## 2020-06-26 ENCOUNTER — Other Ambulatory Visit: Payer: Self-pay

## 2020-06-26 ENCOUNTER — Ambulatory Visit (HOSPITAL_COMMUNITY)
Admission: RE | Admit: 2020-06-26 | Discharge: 2020-06-26 | Disposition: A | Discharge status: Home / Self Care | Payer: Medicare Other | Source: Ambulatory Visit | Attending: Urology | Admitting: Urology

## 2020-06-26 ENCOUNTER — Other Ambulatory Visit (HOSPITAL_COMMUNITY): Payer: Self-pay | Admitting: Urology

## 2020-06-26 DIAGNOSIS — Z85528 Personal history of other malignant neoplasm of kidney: Secondary | ICD-10-CM | POA: Insufficient documentation

## 2020-06-26 DIAGNOSIS — C649 Malignant neoplasm of unspecified kidney, except renal pelvis: Secondary | ICD-10-CM | POA: Diagnosis not present

## 2020-07-03 DIAGNOSIS — C642 Malignant neoplasm of left kidney, except renal pelvis: Secondary | ICD-10-CM | POA: Diagnosis not present

## 2020-07-10 ENCOUNTER — Other Ambulatory Visit: Payer: Self-pay

## 2020-07-10 ENCOUNTER — Ambulatory Visit
Admission: RE | Admit: 2020-07-10 | Discharge: 2020-07-10 | Disposition: A | Discharge status: Home / Self Care | Payer: Medicare Other | Source: Ambulatory Visit | Attending: Internal Medicine | Admitting: Internal Medicine

## 2020-07-10 DIAGNOSIS — Z1231 Encounter for screening mammogram for malignant neoplasm of breast: Secondary | ICD-10-CM

## 2020-07-10 DIAGNOSIS — D23122 Other benign neoplasm of skin of left lower eyelid, including canthus: Secondary | ICD-10-CM | POA: Diagnosis not present

## 2020-07-10 DIAGNOSIS — Z961 Presence of intraocular lens: Secondary | ICD-10-CM | POA: Diagnosis not present

## 2020-09-13 DIAGNOSIS — L650 Telogen effluvium: Secondary | ICD-10-CM | POA: Diagnosis not present

## 2020-09-13 DIAGNOSIS — D1801 Hemangioma of skin and subcutaneous tissue: Secondary | ICD-10-CM | POA: Diagnosis not present

## 2020-10-03 DIAGNOSIS — R635 Abnormal weight gain: Secondary | ICD-10-CM | POA: Diagnosis not present

## 2020-10-03 DIAGNOSIS — Z1211 Encounter for screening for malignant neoplasm of colon: Secondary | ICD-10-CM | POA: Diagnosis not present

## 2020-10-03 DIAGNOSIS — I10 Essential (primary) hypertension: Secondary | ICD-10-CM | POA: Diagnosis not present

## 2020-10-03 DIAGNOSIS — D649 Anemia, unspecified: Secondary | ICD-10-CM | POA: Diagnosis not present

## 2020-10-03 DIAGNOSIS — E785 Hyperlipidemia, unspecified: Secondary | ICD-10-CM | POA: Diagnosis not present

## 2020-10-03 DIAGNOSIS — C642 Malignant neoplasm of left kidney, except renal pelvis: Secondary | ICD-10-CM | POA: Diagnosis not present

## 2020-10-03 DIAGNOSIS — E559 Vitamin D deficiency, unspecified: Secondary | ICD-10-CM | POA: Diagnosis not present

## 2020-10-03 DIAGNOSIS — R7303 Prediabetes: Secondary | ICD-10-CM | POA: Diagnosis not present

## 2020-10-03 DIAGNOSIS — E669 Obesity, unspecified: Secondary | ICD-10-CM | POA: Diagnosis not present

## 2020-10-03 DIAGNOSIS — L659 Nonscarring hair loss, unspecified: Secondary | ICD-10-CM | POA: Diagnosis not present

## 2020-11-13 DIAGNOSIS — Z1211 Encounter for screening for malignant neoplasm of colon: Secondary | ICD-10-CM | POA: Diagnosis not present

## 2020-11-13 DIAGNOSIS — R159 Full incontinence of feces: Secondary | ICD-10-CM | POA: Diagnosis not present

## 2020-11-14 ENCOUNTER — Other Ambulatory Visit: Payer: Self-pay | Admitting: Gastroenterology

## 2020-11-14 ENCOUNTER — Ambulatory Visit
Admission: RE | Admit: 2020-11-14 | Discharge: 2020-11-14 | Disposition: A | Discharge status: Home / Self Care | Payer: Medicare Other | Source: Ambulatory Visit | Attending: Gastroenterology | Admitting: Gastroenterology

## 2020-11-14 DIAGNOSIS — R159 Full incontinence of feces: Secondary | ICD-10-CM

## 2020-11-14 DIAGNOSIS — M47816 Spondylosis without myelopathy or radiculopathy, lumbar region: Secondary | ICD-10-CM | POA: Diagnosis not present

## 2020-11-14 DIAGNOSIS — I878 Other specified disorders of veins: Secondary | ICD-10-CM | POA: Diagnosis not present

## 2021-01-08 DIAGNOSIS — R159 Full incontinence of feces: Secondary | ICD-10-CM | POA: Diagnosis not present

## 2021-01-08 DIAGNOSIS — R195 Other fecal abnormalities: Secondary | ICD-10-CM | POA: Diagnosis not present

## 2021-01-28 DIAGNOSIS — R197 Diarrhea, unspecified: Secondary | ICD-10-CM | POA: Diagnosis not present

## 2021-01-28 DIAGNOSIS — K5730 Diverticulosis of large intestine without perforation or abscess without bleeding: Secondary | ICD-10-CM | POA: Diagnosis not present

## 2021-01-28 IMAGING — MG DIGITAL SCREENING BILAT W/ TOMO W/ CAD
8 series · 8 of 24 positions shown · non-contrast
Comparison: Previous exam(s).

ACR Breast Density Category a: The breast tissue is almost entirely
fatty.

CLINICAL DATA: Screening.

EXAM:
DIGITAL SCREENING BILATERAL MAMMOGRAM WITH TOMO AND CAD

[L MLO synth-2D]
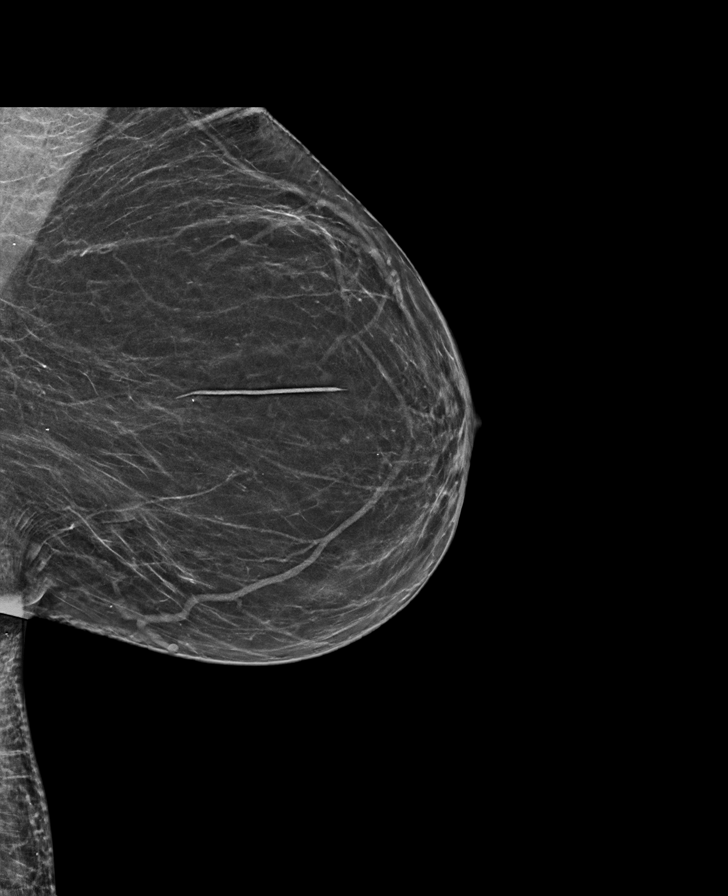

[R MLO synth-2D]
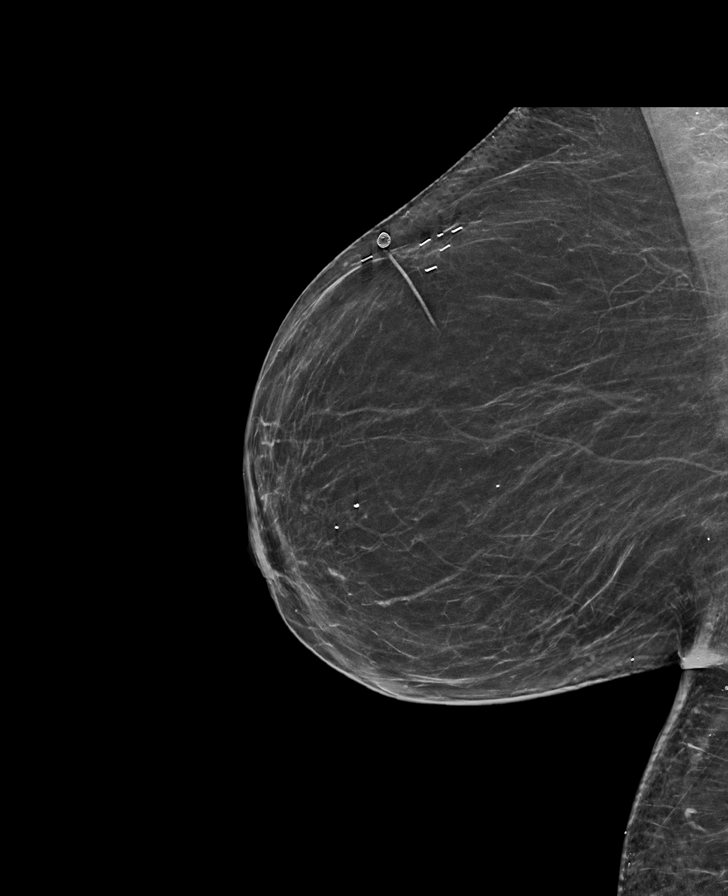

[L CC synth-2D]
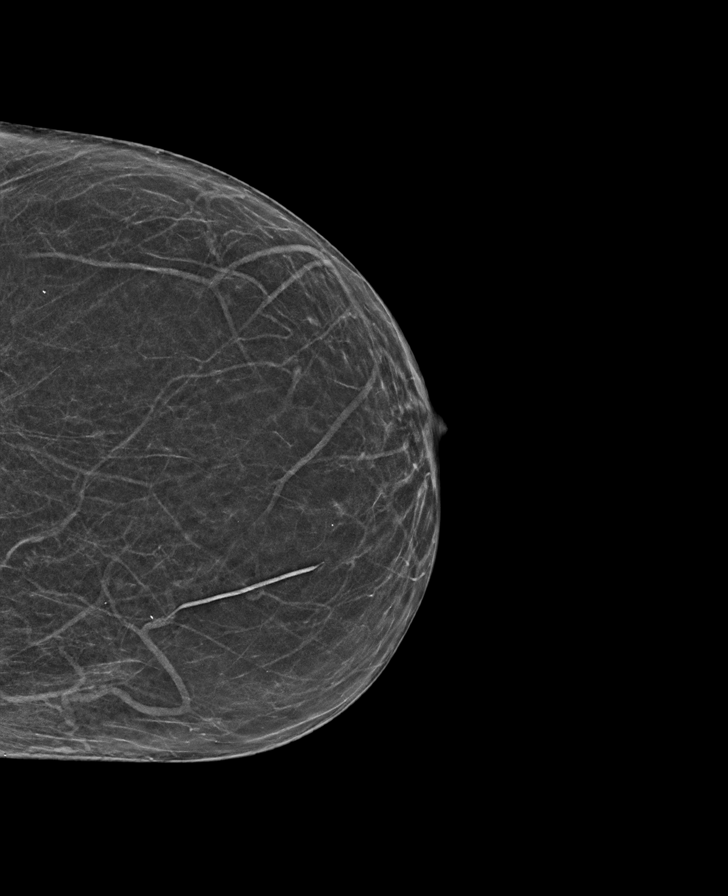

[R CC synth-2D]
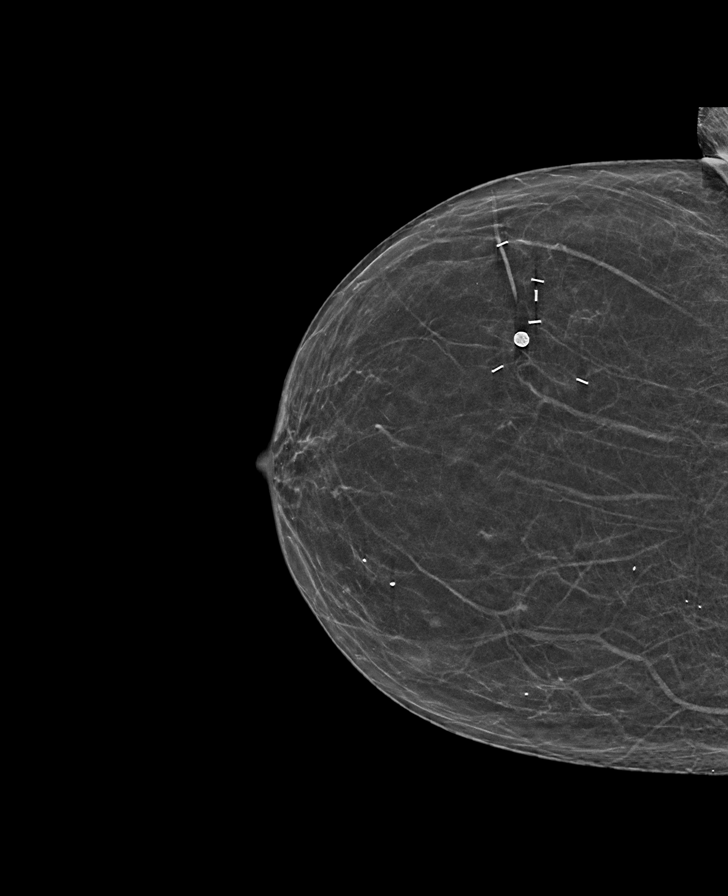

[R MLO tomo · tomo slice 31/62.0]
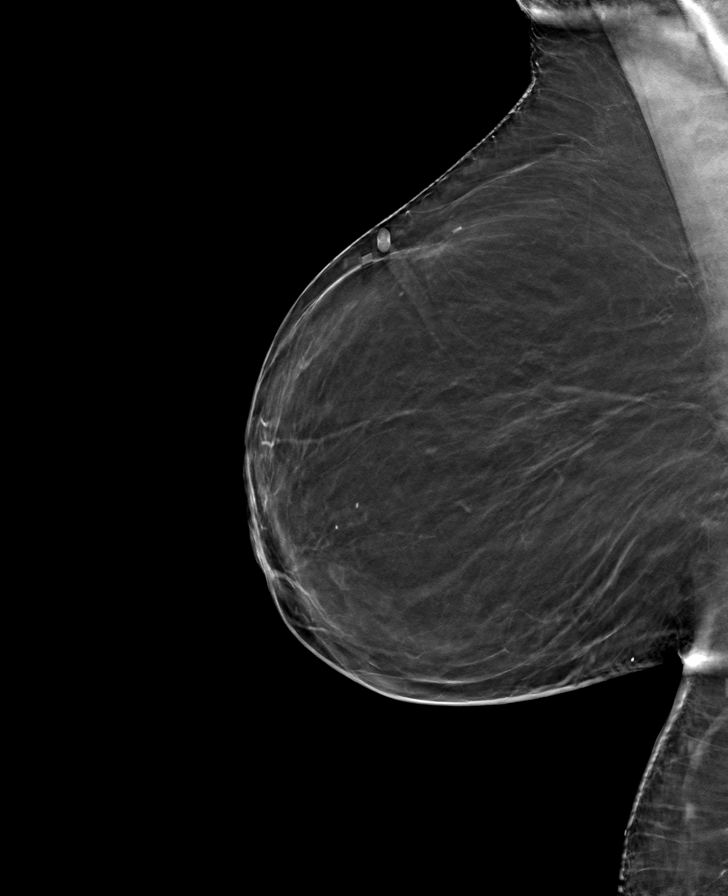

[L MLO tomo · tomo slice 31/60.0]
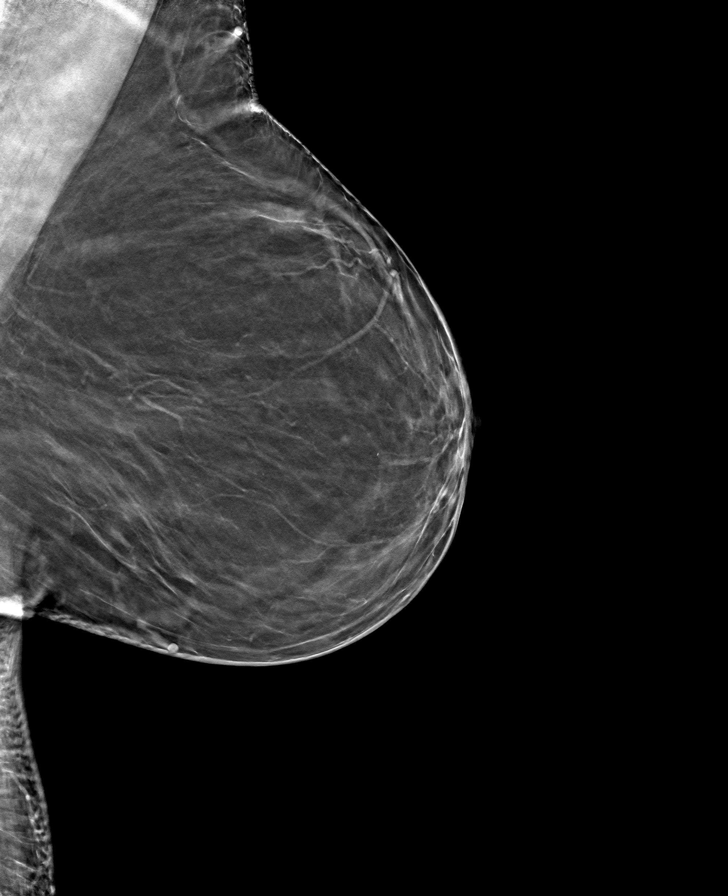

[R CC tomo · tomo slice 27/53.0]
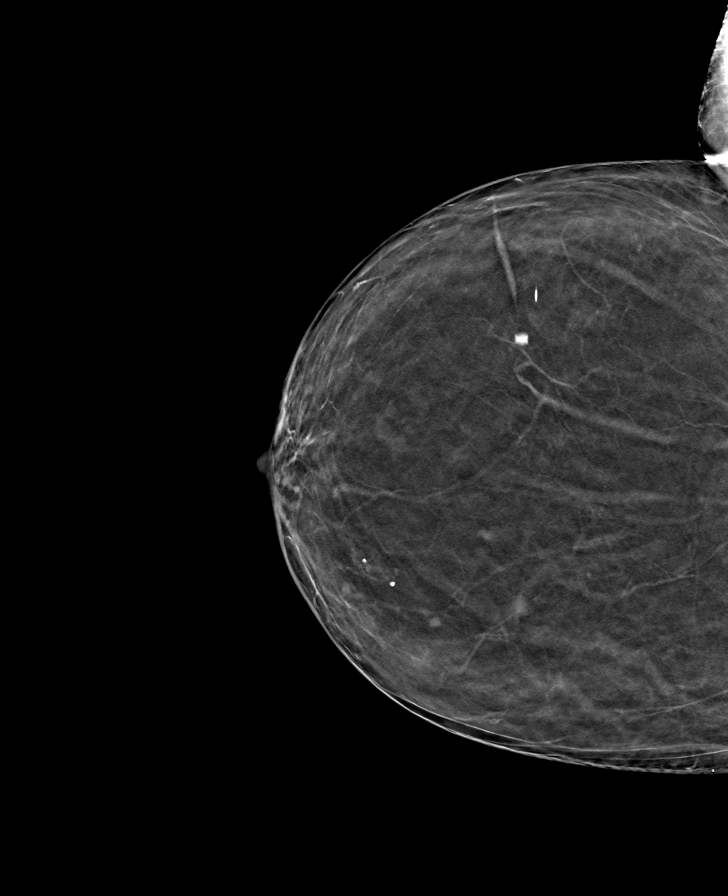

[L CC tomo · tomo slice 26/51.0]
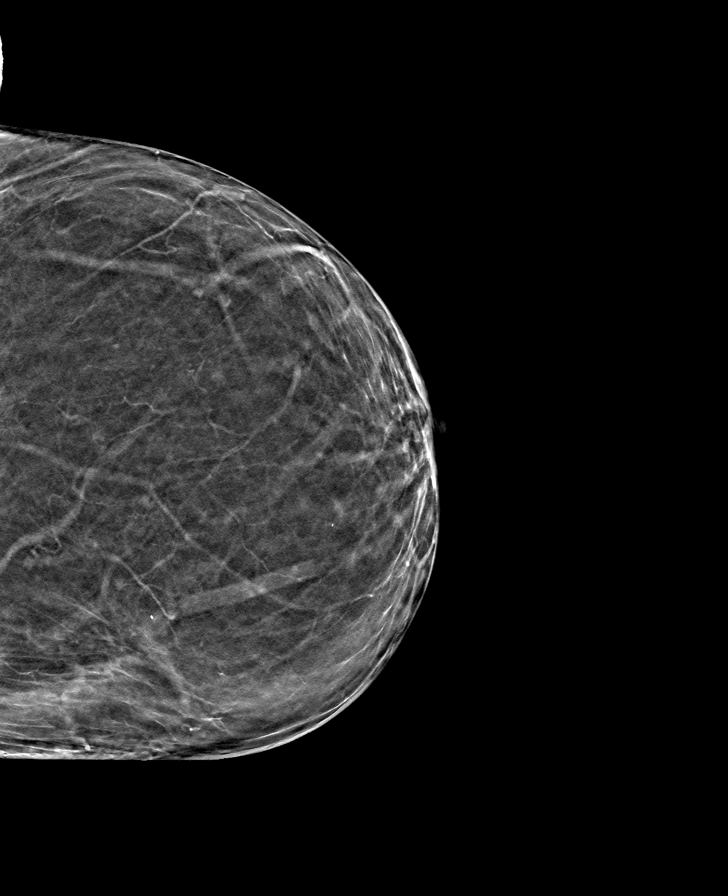

[8 of 24 positions shown; findings below may reference images not displayed]

FINDINGS: There are no findings suspicious for malignancy. Images were
processed with CAD.
IMPRESSION: No mammographic evidence of malignancy. A result letter of this
screening mammogram will be mailed directly to the patient.

RECOMMENDATION:
Screening mammogram in one year. (Code:[id number])

BI-RADS CATEGORY  1: Negative.

## 2021-03-12 DIAGNOSIS — R1030 Lower abdominal pain, unspecified: Secondary | ICD-10-CM | POA: Diagnosis not present

## 2021-03-12 DIAGNOSIS — K5900 Constipation, unspecified: Secondary | ICD-10-CM | POA: Diagnosis not present

## 2021-03-15 DIAGNOSIS — L680 Hirsutism: Secondary | ICD-10-CM | POA: Diagnosis not present

## 2021-03-15 DIAGNOSIS — L659 Nonscarring hair loss, unspecified: Secondary | ICD-10-CM | POA: Diagnosis not present

## 2021-04-16 DIAGNOSIS — Z1389 Encounter for screening for other disorder: Secondary | ICD-10-CM | POA: Diagnosis not present

## 2021-04-16 DIAGNOSIS — Z23 Encounter for immunization: Secondary | ICD-10-CM | POA: Diagnosis not present

## 2021-04-16 DIAGNOSIS — R7303 Prediabetes: Secondary | ICD-10-CM | POA: Diagnosis not present

## 2021-04-16 DIAGNOSIS — R159 Full incontinence of feces: Secondary | ICD-10-CM | POA: Diagnosis not present

## 2021-04-16 DIAGNOSIS — E559 Vitamin D deficiency, unspecified: Secondary | ICD-10-CM | POA: Diagnosis not present

## 2021-04-16 DIAGNOSIS — C642 Malignant neoplasm of left kidney, except renal pelvis: Secondary | ICD-10-CM | POA: Diagnosis not present

## 2021-04-16 DIAGNOSIS — I10 Essential (primary) hypertension: Secondary | ICD-10-CM | POA: Diagnosis not present

## 2021-04-16 DIAGNOSIS — E785 Hyperlipidemia, unspecified: Secondary | ICD-10-CM | POA: Diagnosis not present

## 2021-04-16 DIAGNOSIS — H9012 Conductive hearing loss, unilateral, left ear, with unrestricted hearing on the contralateral side: Secondary | ICD-10-CM | POA: Diagnosis not present

## 2021-04-16 DIAGNOSIS — K5730 Diverticulosis of large intestine without perforation or abscess without bleeding: Secondary | ICD-10-CM | POA: Diagnosis not present

## 2021-04-16 DIAGNOSIS — Z0000 Encounter for general adult medical examination without abnormal findings: Secondary | ICD-10-CM | POA: Diagnosis not present

## 2021-04-16 DIAGNOSIS — J301 Allergic rhinitis due to pollen: Secondary | ICD-10-CM | POA: Diagnosis not present

## 2021-05-02 DIAGNOSIS — Z23 Encounter for immunization: Secondary | ICD-10-CM | POA: Diagnosis not present

## 2021-05-14 DIAGNOSIS — R159 Full incontinence of feces: Secondary | ICD-10-CM | POA: Diagnosis not present

## 2021-06-20 DIAGNOSIS — C642 Malignant neoplasm of left kidney, except renal pelvis: Secondary | ICD-10-CM | POA: Diagnosis not present

## 2021-06-21 ENCOUNTER — Ambulatory Visit (HOSPITAL_COMMUNITY)
Admission: RE | Admit: 2021-06-21 | Discharge: 2021-06-21 | Disposition: A | Discharge status: Home / Self Care | Payer: Medicare Other | Source: Ambulatory Visit | Attending: Urology | Admitting: Urology

## 2021-06-21 ENCOUNTER — Other Ambulatory Visit (HOSPITAL_COMMUNITY): Payer: Self-pay | Admitting: Urology

## 2021-06-21 ENCOUNTER — Other Ambulatory Visit: Payer: Self-pay

## 2021-06-21 DIAGNOSIS — C642 Malignant neoplasm of left kidney, except renal pelvis: Secondary | ICD-10-CM | POA: Insufficient documentation

## 2021-06-27 DIAGNOSIS — Z905 Acquired absence of kidney: Secondary | ICD-10-CM | POA: Diagnosis not present

## 2021-06-27 DIAGNOSIS — Z85528 Personal history of other malignant neoplasm of kidney: Secondary | ICD-10-CM | POA: Diagnosis not present

## 2021-06-27 DIAGNOSIS — C642 Malignant neoplasm of left kidney, except renal pelvis: Secondary | ICD-10-CM | POA: Diagnosis not present

## 2021-06-27 DIAGNOSIS — I700 Atherosclerosis of aorta: Secondary | ICD-10-CM | POA: Diagnosis not present

## 2021-06-27 DIAGNOSIS — D3500 Benign neoplasm of unspecified adrenal gland: Secondary | ICD-10-CM | POA: Diagnosis not present

## 2021-07-04 DIAGNOSIS — C642 Malignant neoplasm of left kidney, except renal pelvis: Secondary | ICD-10-CM | POA: Diagnosis not present

## 2021-07-04 DIAGNOSIS — R3915 Urgency of urination: Secondary | ICD-10-CM | POA: Diagnosis not present

## 2021-07-11 DIAGNOSIS — Z961 Presence of intraocular lens: Secondary | ICD-10-CM | POA: Diagnosis not present

## 2021-10-17 DIAGNOSIS — I10 Essential (primary) hypertension: Secondary | ICD-10-CM | POA: Diagnosis not present

## 2021-10-17 DIAGNOSIS — Z23 Encounter for immunization: Secondary | ICD-10-CM | POA: Diagnosis not present

## 2021-10-17 DIAGNOSIS — R7303 Prediabetes: Secondary | ICD-10-CM | POA: Diagnosis not present

## 2021-10-17 DIAGNOSIS — K649 Unspecified hemorrhoids: Secondary | ICD-10-CM | POA: Diagnosis not present

## 2021-10-17 DIAGNOSIS — K5730 Diverticulosis of large intestine without perforation or abscess without bleeding: Secondary | ICD-10-CM | POA: Diagnosis not present

## 2022-03-05 DIAGNOSIS — Z23 Encounter for immunization: Secondary | ICD-10-CM | POA: Diagnosis not present

## 2022-03-05 DIAGNOSIS — M542 Cervicalgia: Secondary | ICD-10-CM | POA: Diagnosis not present

## 2022-03-05 DIAGNOSIS — I10 Essential (primary) hypertension: Secondary | ICD-10-CM | POA: Diagnosis not present

## 2022-03-13 ENCOUNTER — Other Ambulatory Visit: Payer: Self-pay | Admitting: Internal Medicine

## 2022-03-13 DIAGNOSIS — Z1231 Encounter for screening mammogram for malignant neoplasm of breast: Secondary | ICD-10-CM

## 2022-04-01 DIAGNOSIS — L218 Other seborrheic dermatitis: Secondary | ICD-10-CM | POA: Diagnosis not present

## 2022-04-01 DIAGNOSIS — L720 Epidermal cyst: Secondary | ICD-10-CM | POA: Diagnosis not present

## 2022-04-01 DIAGNOSIS — D1801 Hemangioma of skin and subcutaneous tissue: Secondary | ICD-10-CM | POA: Diagnosis not present

## 2022-04-18 DIAGNOSIS — Z0000 Encounter for general adult medical examination without abnormal findings: Secondary | ICD-10-CM | POA: Diagnosis not present

## 2022-04-18 DIAGNOSIS — E559 Vitamin D deficiency, unspecified: Secondary | ICD-10-CM | POA: Diagnosis not present

## 2022-04-18 DIAGNOSIS — C642 Malignant neoplasm of left kidney, except renal pelvis: Secondary | ICD-10-CM | POA: Diagnosis not present

## 2022-04-18 DIAGNOSIS — E785 Hyperlipidemia, unspecified: Secondary | ICD-10-CM | POA: Diagnosis not present

## 2022-04-18 DIAGNOSIS — I10 Essential (primary) hypertension: Secondary | ICD-10-CM | POA: Diagnosis not present

## 2022-04-18 DIAGNOSIS — Z1331 Encounter for screening for depression: Secondary | ICD-10-CM | POA: Diagnosis not present

## 2022-04-18 DIAGNOSIS — M6248 Contracture of muscle, other site: Secondary | ICD-10-CM | POA: Diagnosis not present

## 2022-04-18 DIAGNOSIS — K5730 Diverticulosis of large intestine without perforation or abscess without bleeding: Secondary | ICD-10-CM | POA: Diagnosis not present

## 2022-04-18 DIAGNOSIS — R7303 Prediabetes: Secondary | ICD-10-CM | POA: Diagnosis not present

## 2022-04-22 ENCOUNTER — Ambulatory Visit
Admission: RE | Admit: 2022-04-22 | Discharge: 2022-04-22 | Disposition: A | Discharge status: Home / Self Care | Payer: Medicare Other | Source: Ambulatory Visit | Attending: Internal Medicine | Admitting: Internal Medicine

## 2022-04-22 ENCOUNTER — Other Ambulatory Visit: Payer: Self-pay | Admitting: Internal Medicine

## 2022-04-22 DIAGNOSIS — M62838 Other muscle spasm: Secondary | ICD-10-CM

## 2022-04-22 DIAGNOSIS — M47812 Spondylosis without myelopathy or radiculopathy, cervical region: Secondary | ICD-10-CM | POA: Diagnosis not present

## 2022-04-22 DIAGNOSIS — M2578 Osteophyte, vertebrae: Secondary | ICD-10-CM | POA: Diagnosis not present

## 2022-04-22 DIAGNOSIS — M4312 Spondylolisthesis, cervical region: Secondary | ICD-10-CM | POA: Diagnosis not present

## 2022-05-06 ENCOUNTER — Ambulatory Visit
Admission: RE | Admit: 2022-05-06 | Discharge: 2022-05-06 | Disposition: A | Discharge status: Home / Self Care | Payer: Medicare Other | Source: Ambulatory Visit | Attending: Internal Medicine | Admitting: Internal Medicine

## 2022-05-06 DIAGNOSIS — Z1231 Encounter for screening mammogram for malignant neoplasm of breast: Secondary | ICD-10-CM

## 2022-05-23 DIAGNOSIS — M542 Cervicalgia: Secondary | ICD-10-CM | POA: Diagnosis not present

## 2022-05-23 DIAGNOSIS — M47812 Spondylosis without myelopathy or radiculopathy, cervical region: Secondary | ICD-10-CM | POA: Diagnosis not present

## 2022-06-27 ENCOUNTER — Ambulatory Visit (HOSPITAL_COMMUNITY)
Admission: RE | Admit: 2022-06-27 | Discharge: 2022-06-27 | Disposition: A | Discharge status: Home / Self Care | Payer: Medicare Other | Source: Ambulatory Visit | Attending: Urology | Admitting: Urology

## 2022-06-27 ENCOUNTER — Other Ambulatory Visit (HOSPITAL_COMMUNITY): Payer: Self-pay | Admitting: Urology

## 2022-06-27 DIAGNOSIS — C642 Malignant neoplasm of left kidney, except renal pelvis: Secondary | ICD-10-CM

## 2022-06-27 DIAGNOSIS — C649 Malignant neoplasm of unspecified kidney, except renal pelvis: Secondary | ICD-10-CM | POA: Diagnosis not present

## 2022-06-30 DIAGNOSIS — C642 Malignant neoplasm of left kidney, except renal pelvis: Secondary | ICD-10-CM | POA: Diagnosis not present

## 2022-06-30 DIAGNOSIS — N281 Cyst of kidney, acquired: Secondary | ICD-10-CM | POA: Diagnosis not present

## 2022-06-30 DIAGNOSIS — D3501 Benign neoplasm of right adrenal gland: Secondary | ICD-10-CM | POA: Diagnosis not present

## 2022-06-30 DIAGNOSIS — D3502 Benign neoplasm of left adrenal gland: Secondary | ICD-10-CM | POA: Diagnosis not present

## 2022-07-04 DIAGNOSIS — R3915 Urgency of urination: Secondary | ICD-10-CM | POA: Diagnosis not present

## 2022-07-04 DIAGNOSIS — D3502 Benign neoplasm of left adrenal gland: Secondary | ICD-10-CM | POA: Diagnosis not present

## 2022-07-04 DIAGNOSIS — C642 Malignant neoplasm of left kidney, except renal pelvis: Secondary | ICD-10-CM | POA: Diagnosis not present

## 2022-07-18 DIAGNOSIS — I10 Essential (primary) hypertension: Secondary | ICD-10-CM | POA: Diagnosis not present

## 2022-07-18 DIAGNOSIS — D3501 Benign neoplasm of right adrenal gland: Secondary | ICD-10-CM | POA: Diagnosis not present

## 2022-07-18 DIAGNOSIS — E559 Vitamin D deficiency, unspecified: Secondary | ICD-10-CM | POA: Diagnosis not present

## 2022-07-18 DIAGNOSIS — C642 Malignant neoplasm of left kidney, except renal pelvis: Secondary | ICD-10-CM | POA: Diagnosis not present

## 2022-09-17 DIAGNOSIS — E559 Vitamin D deficiency, unspecified: Secondary | ICD-10-CM | POA: Diagnosis not present

## 2022-10-23 DIAGNOSIS — Z961 Presence of intraocular lens: Secondary | ICD-10-CM | POA: Diagnosis not present

## 2023-04-21 DIAGNOSIS — Z1331 Encounter for screening for depression: Secondary | ICD-10-CM | POA: Diagnosis not present

## 2023-04-21 DIAGNOSIS — E559 Vitamin D deficiency, unspecified: Secondary | ICD-10-CM | POA: Diagnosis not present

## 2023-04-21 DIAGNOSIS — I10 Essential (primary) hypertension: Secondary | ICD-10-CM | POA: Diagnosis not present

## 2023-04-21 DIAGNOSIS — D3501 Benign neoplasm of right adrenal gland: Secondary | ICD-10-CM | POA: Diagnosis not present

## 2023-04-21 DIAGNOSIS — H9012 Conductive hearing loss, unilateral, left ear, with unrestricted hearing on the contralateral side: Secondary | ICD-10-CM | POA: Diagnosis not present

## 2023-04-21 DIAGNOSIS — E785 Hyperlipidemia, unspecified: Secondary | ICD-10-CM | POA: Diagnosis not present

## 2023-04-21 DIAGNOSIS — Z0000 Encounter for general adult medical examination without abnormal findings: Secondary | ICD-10-CM | POA: Diagnosis not present

## 2023-04-21 DIAGNOSIS — Z23 Encounter for immunization: Secondary | ICD-10-CM | POA: Diagnosis not present

## 2023-04-21 DIAGNOSIS — N2889 Other specified disorders of kidney and ureter: Secondary | ICD-10-CM | POA: Diagnosis not present

## 2023-04-21 DIAGNOSIS — K5730 Diverticulosis of large intestine without perforation or abscess without bleeding: Secondary | ICD-10-CM | POA: Diagnosis not present

## 2023-06-02 ENCOUNTER — Ambulatory Visit (HOSPITAL_COMMUNITY)
Admission: RE | Admit: 2023-06-02 | Discharge: 2023-06-02 | Disposition: A | Discharge status: Home / Self Care | Payer: Medicare Other | Source: Ambulatory Visit | Attending: Urology | Admitting: Urology

## 2023-06-02 ENCOUNTER — Other Ambulatory Visit (HOSPITAL_COMMUNITY): Payer: Self-pay | Admitting: Urology

## 2023-06-02 DIAGNOSIS — C642 Malignant neoplasm of left kidney, except renal pelvis: Secondary | ICD-10-CM | POA: Insufficient documentation

## 2023-06-02 DIAGNOSIS — Z905 Acquired absence of kidney: Secondary | ICD-10-CM | POA: Diagnosis not present

## 2023-06-09 DIAGNOSIS — C642 Malignant neoplasm of left kidney, except renal pelvis: Secondary | ICD-10-CM | POA: Diagnosis not present

## 2023-06-09 DIAGNOSIS — K8020 Calculus of gallbladder without cholecystitis without obstruction: Secondary | ICD-10-CM | POA: Diagnosis not present

## 2023-06-09 DIAGNOSIS — Z85528 Personal history of other malignant neoplasm of kidney: Secondary | ICD-10-CM | POA: Diagnosis not present

## 2023-06-09 DIAGNOSIS — N200 Calculus of kidney: Secondary | ICD-10-CM | POA: Diagnosis not present

## 2023-06-23 DIAGNOSIS — M25512 Pain in left shoulder: Secondary | ICD-10-CM | POA: Diagnosis not present

## 2023-06-29 DIAGNOSIS — Z85528 Personal history of other malignant neoplasm of kidney: Secondary | ICD-10-CM | POA: Diagnosis not present

## 2023-06-29 DIAGNOSIS — R3915 Urgency of urination: Secondary | ICD-10-CM | POA: Diagnosis not present

## 2023-06-29 DIAGNOSIS — D3502 Benign neoplasm of left adrenal gland: Secondary | ICD-10-CM | POA: Diagnosis not present

## 2023-06-30 DIAGNOSIS — M25512 Pain in left shoulder: Secondary | ICD-10-CM | POA: Diagnosis not present

## 2023-07-07 ENCOUNTER — Other Ambulatory Visit: Payer: Self-pay | Admitting: Orthopaedic Surgery

## 2023-07-07 DIAGNOSIS — M25512 Pain in left shoulder: Secondary | ICD-10-CM | POA: Diagnosis not present

## 2023-07-07 DIAGNOSIS — G8929 Other chronic pain: Secondary | ICD-10-CM

## 2023-07-17 ENCOUNTER — Telehealth (HOSPITAL_BASED_OUTPATIENT_CLINIC_OR_DEPARTMENT_OTHER): Payer: Self-pay

## 2023-07-17 DIAGNOSIS — R9431 Abnormal electrocardiogram [ECG] [EKG]: Secondary | ICD-10-CM | POA: Diagnosis not present

## 2023-07-17 DIAGNOSIS — Z01818 Encounter for other preprocedural examination: Secondary | ICD-10-CM | POA: Diagnosis not present

## 2023-07-17 DIAGNOSIS — C642 Malignant neoplasm of left kidney, except renal pelvis: Secondary | ICD-10-CM | POA: Diagnosis not present

## 2023-07-17 DIAGNOSIS — I700 Atherosclerosis of aorta: Secondary | ICD-10-CM | POA: Diagnosis not present

## 2023-07-17 DIAGNOSIS — I10 Essential (primary) hypertension: Secondary | ICD-10-CM | POA: Diagnosis not present

## 2023-07-17 NOTE — Telephone Encounter (Signed)
   Pre-operative Risk Assessment    Patient Name: Elaine Harris  DOB: 03/17/1946 MRN: [id number]   Date of last office visit: New Patient  Date of next office visit: 07/21/2023   Request for Surgical Clearance    Procedure:   Left reverse total shoulder arthroplasty, open cuff repair   Date of Surgery:  Clearance TBD                                Surgeon:  Ramond Marrow, MD Surgeon's Group or Practice Name:  [facility]  Phone number:  [phone] Fax number:  office fax- [phone] surgery fax [phone]   Type of Clearance Requested:   - Medical    Type of Anesthesia:  General  interscalene block    Additional requests/questions:  Please advise surgeon/provider what medications should be held. Please fax a copy of EKG & office note  to the surgeon's office.  Signed, Marlene Lard   07/17/2023, 3:20 PM

## 2023-07-17 NOTE — Telephone Encounter (Signed)
 Received call from patient, that she is needing preoperative clearance. Had an abnormal EKG at [facility] with Dr. Lorenda Ishihara. Records requested. Records to be sent!   Surgery to be performed by [facility], will request preoperative clearance request from them as well.

## 2023-07-20 NOTE — Progress Notes (Unsigned)
 Cardiology Office Note:  .   Date:  07/21/2023  ID:  Elaine Harris, DOB August 14, 1945, MRN [id number] PCP: Lorenda Ishihara, MD  [facility] Providers Cardiologist:  None Cardiology APP:  Alver Sorrow, NP    History of Present Illness: .   Elaine Harris is a 78 y.o. female with hx of renal carcinoma s/p left partial nephrectomy 02/2020, aortic atherosclerosis, HTN, vitamin D deficiency, HLD, LAFB.   Family history notable for heart attack in her father in his 8s and he eventually passed away from lung cancer.   Presents today to establish with cardiology and for preoperative clearance due to abnormal EKG. Pending shoulder arthroplasty after a fall 06/23/23. EKG dated 07/17/23 independently reviewed NSR 67 bpm with isolated PVC, left axis deviation (interpretation limited due to poor fax quality). Pleasant lady who is a never smoker and does not drink alcohol. Notes she tripped over a step and fell causing her shoulder injury. No concerning features such as lightheadedness, syncope.   Reports no shortness of breath nor dyspnea on exertion. Reports no chest pain, pressure, or tightness. No edema, orthopnea, PND. Reports rare palpitations described as more forceful heartbeat which self resolved and she associates with caffeine from [employer].  ROS: Please see the history of present illness.    All other systems reviewed and are negative.   Studies Reviewed: Marland Kitchen   EKG Interpretation Date/Time:  Tuesday July 21 2023 08:44:08 EST Ventricular Rate:  71 PR Interval:  174 QRS Duration:  82 QT Interval:  370 QTC Calculation: 402 R Axis:   -70  Text Interpretation: Normal sinus rhythm Left anterior fascicular block  No acute ST/T wave changes. Confirmed by Gillian Shields ([id number]) on 07/21/2023 8:45:55 AM     Risk Assessment/Calculations:             Physical Exam:   VS:  BP 126/80   Pulse 71   Ht 5\' 4"  (1.626 m)   Wt 190 lb 3.2 oz (86.3 kg)   SpO2 95%   BMI 32.65  kg/m    Wt Readings from Last 3 Encounters:  07/21/23 190 lb 3.2 oz (86.3 kg)  02/17/20 189 lb 3.2 oz (85.8 kg)  02/07/20 188 lb 1 oz (85.3 kg)    GEN: Well nourished, overweight, well developed in no acute distress NECK: No JVD; No carotid bruits CARDIAC: RRR, no murmurs, rubs, gallops RESPIRATORY:  Clear to auscultation without rales, wheezing or rhonchi  ABDOMEN: Soft, non-tender, non-distended EXTREMITIES:  No edema; No deformity   ASSESSMENT AND PLAN: .    Preop clearance - Pending shoulder surgery. Stable with no anginal symptoms. No indication for ischemic evaluation.  According to the Revised Cardiac Risk Index (RCRI), her Perioperative Risk of Major Cardiac Event is (%): 0.4. Her Functional Capacity in METs is: 5.62 according to the Duke Activity Status Index (DASI). Per AHA/ACC guidelines, she is deemed acceptable risk for the planned procedure without additional cardiovascular testing. Will route to surgical team so they are aware.  Recommend holding for fish oil 1 week prior to procedure.  No additional medications from cardiac perspective that need to be held prior to her surgery.  HTN - BP well controlled. Continue current antihypertensive regimen lisinopril 10 mg daily. Discussed to monitor BP at home at least 2 hours after medications and sitting for 5-10 minutes.   Aortic atherosclerosis / HLD / Family history of CAD -prior CT aortic atherosclerosis. PCP recently added Rosuvastatin 5mg  three times per week, agree with  plan. Her father had MI in his 60s, no prior ischemic evaluation. She has no anginal symptoms and EKG toady NSR with stable LAFB. Consider discussion of cardiac CTA at follow up due to family history.  No ischemic evaluation required prior to surgery, as above.   PVC-isolated PVC by EKG with PCP 07/09/2023.  Reports rare palpitations which self resolve which she associates with caffeine.  Is not bothersome, no plan for further workup. If more bothersome in future,  consider ZIO monitor.  Encouraged to stay well-hydrated, limit caffeine.  LAFB - Noted on EKG dating back 12/2019.  Stable finding by EKG.  No concerning features such as lightheadedness, dizziness.  Monitor with periodic EKG.  Anticipate this is age-related.       Dispo: Follow up in 3 months with Alver Sorrow, NP. Prefers to establish with Dr. Duke Salvia in future as her primary cardiologist as her husband also sees Dr. Duke Salvia.  Signed, Alver Sorrow, NP

## 2023-07-21 ENCOUNTER — Encounter (HOSPITAL_BASED_OUTPATIENT_CLINIC_OR_DEPARTMENT_OTHER): Payer: Self-pay | Admitting: Family

## 2023-07-21 ENCOUNTER — Ambulatory Visit (INDEPENDENT_AMBULATORY_CARE_PROVIDER_SITE_OTHER): Payer: Medicare Other | Admitting: Family

## 2023-07-21 VITALS — BP 126/80 | HR 71 | Ht 64.0 in | Wt 190.2 lb

## 2023-07-21 DIAGNOSIS — I700 Atherosclerosis of aorta: Secondary | ICD-10-CM | POA: Diagnosis not present

## 2023-07-21 DIAGNOSIS — I10 Essential (primary) hypertension: Secondary | ICD-10-CM | POA: Diagnosis not present

## 2023-07-21 DIAGNOSIS — Z01818 Encounter for other preprocedural examination: Secondary | ICD-10-CM | POA: Diagnosis not present

## 2023-07-21 DIAGNOSIS — E785 Hyperlipidemia, unspecified: Secondary | ICD-10-CM

## 2023-07-21 DIAGNOSIS — I493 Ventricular premature depolarization: Secondary | ICD-10-CM

## 2023-07-21 DIAGNOSIS — I444 Left anterior fascicular block: Secondary | ICD-10-CM

## 2023-07-21 NOTE — Patient Instructions (Addendum)
 Medication Instructions:  Your physician recommends that you continue on your current medications as directed. Please refer to the Current Medication list given to you today.  Testing/Procedures: Your EKG shows normal sinus rhythm with left anterior fascicular block. This means there is a small amount of slowing in one of the electrical pathways in your heart. It is not of concern and a stable finding from 2021.   Follow-Up: At [facility], you and your health needs are our priority.  As part of our continuing mission to provide you with exceptional heart care, we have created designated Provider Care Teams.  These Care Teams include your primary Cardiologist (physician) and Advanced Practice Providers (APPs -  Physician Assistants and Nurse Practitioners) who all work together to provide you with the care you need, when you need it.  We recommend signing up for the patient portal called "MyChart".  Sign up information is provided on this After Visit Summary.  MyChart is used to connect with patients for Virtual Visits (Telemedicine).  Patients are able to view lab/test results, encounter notes, upcoming appointments, etc.  Non-urgent messages can be sent to your provider as well.   To learn more about what you can do with MyChart, go to [email or web address].    Your next appointment:   Follow up as scheduled

## 2023-07-22 ENCOUNTER — Ambulatory Visit: Payer: Medicare Other | Admitting: Cardiology

## 2023-07-23 DIAGNOSIS — M25512 Pain in left shoulder: Secondary | ICD-10-CM | POA: Diagnosis not present

## 2023-07-30 ENCOUNTER — Ambulatory Visit
Admission: RE | Admit: 2023-07-30 | Discharge: 2023-07-30 | Disposition: A | Discharge status: Home / Self Care | Payer: Medicare Other | Source: Ambulatory Visit | Attending: Orthopaedic Surgery | Admitting: Orthopaedic Surgery

## 2023-07-30 DIAGNOSIS — M25512 Pain in left shoulder: Secondary | ICD-10-CM | POA: Diagnosis not present

## 2023-07-30 DIAGNOSIS — M19012 Primary osteoarthritis, left shoulder: Secondary | ICD-10-CM | POA: Diagnosis not present

## 2023-08-05 DIAGNOSIS — M19012 Primary osteoarthritis, left shoulder: Secondary | ICD-10-CM | POA: Diagnosis not present

## 2023-08-05 DIAGNOSIS — M75102 Unspecified rotator cuff tear or rupture of left shoulder, not specified as traumatic: Secondary | ICD-10-CM | POA: Diagnosis not present

## 2023-08-05 DIAGNOSIS — G8918 Other acute postprocedural pain: Secondary | ICD-10-CM | POA: Diagnosis not present

## 2023-08-07 DIAGNOSIS — M25612 Stiffness of left shoulder, not elsewhere classified: Secondary | ICD-10-CM | POA: Diagnosis not present

## 2023-08-07 DIAGNOSIS — M19012 Primary osteoarthritis, left shoulder: Secondary | ICD-10-CM | POA: Diagnosis not present

## 2023-08-07 DIAGNOSIS — S46012D Strain of muscle(s) and tendon(s) of the rotator cuff of left shoulder, subsequent encounter: Secondary | ICD-10-CM | POA: Diagnosis not present

## 2023-08-07 DIAGNOSIS — M6281 Muscle weakness (generalized): Secondary | ICD-10-CM | POA: Diagnosis not present

## 2023-08-14 DIAGNOSIS — M19012 Primary osteoarthritis, left shoulder: Secondary | ICD-10-CM | POA: Diagnosis not present

## 2023-08-17 DIAGNOSIS — M25612 Stiffness of left shoulder, not elsewhere classified: Secondary | ICD-10-CM | POA: Diagnosis not present

## 2023-08-17 DIAGNOSIS — M19012 Primary osteoarthritis, left shoulder: Secondary | ICD-10-CM | POA: Diagnosis not present

## 2023-08-17 DIAGNOSIS — M6281 Muscle weakness (generalized): Secondary | ICD-10-CM | POA: Diagnosis not present

## 2023-08-17 DIAGNOSIS — S46012D Strain of muscle(s) and tendon(s) of the rotator cuff of left shoulder, subsequent encounter: Secondary | ICD-10-CM | POA: Diagnosis not present

## 2023-08-20 DIAGNOSIS — M6281 Muscle weakness (generalized): Secondary | ICD-10-CM | POA: Diagnosis not present

## 2023-08-20 DIAGNOSIS — M25612 Stiffness of left shoulder, not elsewhere classified: Secondary | ICD-10-CM | POA: Diagnosis not present

## 2023-08-20 DIAGNOSIS — M19012 Primary osteoarthritis, left shoulder: Secondary | ICD-10-CM | POA: Diagnosis not present

## 2023-08-20 DIAGNOSIS — S46012D Strain of muscle(s) and tendon(s) of the rotator cuff of left shoulder, subsequent encounter: Secondary | ICD-10-CM | POA: Diagnosis not present

## 2023-08-24 DIAGNOSIS — M19012 Primary osteoarthritis, left shoulder: Secondary | ICD-10-CM | POA: Diagnosis not present

## 2023-08-24 DIAGNOSIS — S46012D Strain of muscle(s) and tendon(s) of the rotator cuff of left shoulder, subsequent encounter: Secondary | ICD-10-CM | POA: Diagnosis not present

## 2023-08-24 DIAGNOSIS — M6281 Muscle weakness (generalized): Secondary | ICD-10-CM | POA: Diagnosis not present

## 2023-08-24 DIAGNOSIS — M25612 Stiffness of left shoulder, not elsewhere classified: Secondary | ICD-10-CM | POA: Diagnosis not present

## 2023-08-28 DIAGNOSIS — S46012D Strain of muscle(s) and tendon(s) of the rotator cuff of left shoulder, subsequent encounter: Secondary | ICD-10-CM | POA: Diagnosis not present

## 2023-08-28 DIAGNOSIS — M25612 Stiffness of left shoulder, not elsewhere classified: Secondary | ICD-10-CM | POA: Diagnosis not present

## 2023-08-28 DIAGNOSIS — M19012 Primary osteoarthritis, left shoulder: Secondary | ICD-10-CM | POA: Diagnosis not present

## 2023-08-28 DIAGNOSIS — M6281 Muscle weakness (generalized): Secondary | ICD-10-CM | POA: Diagnosis not present

## 2023-09-03 DIAGNOSIS — S46012D Strain of muscle(s) and tendon(s) of the rotator cuff of left shoulder, subsequent encounter: Secondary | ICD-10-CM | POA: Diagnosis not present

## 2023-09-03 DIAGNOSIS — M6281 Muscle weakness (generalized): Secondary | ICD-10-CM | POA: Diagnosis not present

## 2023-09-03 DIAGNOSIS — M25612 Stiffness of left shoulder, not elsewhere classified: Secondary | ICD-10-CM | POA: Diagnosis not present

## 2023-09-03 DIAGNOSIS — M19012 Primary osteoarthritis, left shoulder: Secondary | ICD-10-CM | POA: Diagnosis not present

## 2023-09-10 DIAGNOSIS — M25612 Stiffness of left shoulder, not elsewhere classified: Secondary | ICD-10-CM | POA: Diagnosis not present

## 2023-09-10 DIAGNOSIS — M6281 Muscle weakness (generalized): Secondary | ICD-10-CM | POA: Diagnosis not present

## 2023-09-10 DIAGNOSIS — S46012D Strain of muscle(s) and tendon(s) of the rotator cuff of left shoulder, subsequent encounter: Secondary | ICD-10-CM | POA: Diagnosis not present

## 2023-09-10 DIAGNOSIS — M19012 Primary osteoarthritis, left shoulder: Secondary | ICD-10-CM | POA: Diagnosis not present

## 2023-09-11 DIAGNOSIS — M19012 Primary osteoarthritis, left shoulder: Secondary | ICD-10-CM | POA: Diagnosis not present

## 2023-09-18 DIAGNOSIS — M25612 Stiffness of left shoulder, not elsewhere classified: Secondary | ICD-10-CM | POA: Diagnosis not present

## 2023-09-18 DIAGNOSIS — M19012 Primary osteoarthritis, left shoulder: Secondary | ICD-10-CM | POA: Diagnosis not present

## 2023-09-18 DIAGNOSIS — M6281 Muscle weakness (generalized): Secondary | ICD-10-CM | POA: Diagnosis not present

## 2023-09-18 DIAGNOSIS — S46012D Strain of muscle(s) and tendon(s) of the rotator cuff of left shoulder, subsequent encounter: Secondary | ICD-10-CM | POA: Diagnosis not present

## 2023-09-23 DIAGNOSIS — M6281 Muscle weakness (generalized): Secondary | ICD-10-CM | POA: Diagnosis not present

## 2023-09-23 DIAGNOSIS — M25612 Stiffness of left shoulder, not elsewhere classified: Secondary | ICD-10-CM | POA: Diagnosis not present

## 2023-09-23 DIAGNOSIS — M19012 Primary osteoarthritis, left shoulder: Secondary | ICD-10-CM | POA: Diagnosis not present

## 2023-09-23 DIAGNOSIS — S46012D Strain of muscle(s) and tendon(s) of the rotator cuff of left shoulder, subsequent encounter: Secondary | ICD-10-CM | POA: Diagnosis not present

## 2023-10-09 DIAGNOSIS — S46012D Strain of muscle(s) and tendon(s) of the rotator cuff of left shoulder, subsequent encounter: Secondary | ICD-10-CM | POA: Diagnosis not present

## 2023-10-09 DIAGNOSIS — M6281 Muscle weakness (generalized): Secondary | ICD-10-CM | POA: Diagnosis not present

## 2023-10-09 DIAGNOSIS — M19012 Primary osteoarthritis, left shoulder: Secondary | ICD-10-CM | POA: Diagnosis not present

## 2023-10-09 DIAGNOSIS — M25612 Stiffness of left shoulder, not elsewhere classified: Secondary | ICD-10-CM | POA: Diagnosis not present

## 2023-10-16 DIAGNOSIS — M6281 Muscle weakness (generalized): Secondary | ICD-10-CM | POA: Diagnosis not present

## 2023-10-16 DIAGNOSIS — M19012 Primary osteoarthritis, left shoulder: Secondary | ICD-10-CM | POA: Diagnosis not present

## 2023-10-16 DIAGNOSIS — S46012D Strain of muscle(s) and tendon(s) of the rotator cuff of left shoulder, subsequent encounter: Secondary | ICD-10-CM | POA: Diagnosis not present

## 2023-10-16 DIAGNOSIS — M25612 Stiffness of left shoulder, not elsewhere classified: Secondary | ICD-10-CM | POA: Diagnosis not present

## 2023-10-20 ENCOUNTER — Ambulatory Visit (HOSPITAL_BASED_OUTPATIENT_CLINIC_OR_DEPARTMENT_OTHER): Admitting: Family

## 2023-10-20 DIAGNOSIS — E559 Vitamin D deficiency, unspecified: Secondary | ICD-10-CM | POA: Diagnosis not present

## 2023-10-20 DIAGNOSIS — C642 Malignant neoplasm of left kidney, except renal pelvis: Secondary | ICD-10-CM | POA: Diagnosis not present

## 2023-10-20 DIAGNOSIS — M25512 Pain in left shoulder: Secondary | ICD-10-CM | POA: Diagnosis not present

## 2023-10-20 DIAGNOSIS — I10 Essential (primary) hypertension: Secondary | ICD-10-CM | POA: Diagnosis not present

## 2023-10-20 DIAGNOSIS — R5383 Other fatigue: Secondary | ICD-10-CM | POA: Diagnosis not present

## 2023-10-20 DIAGNOSIS — I700 Atherosclerosis of aorta: Secondary | ICD-10-CM | POA: Diagnosis not present

## 2023-10-23 DIAGNOSIS — M6281 Muscle weakness (generalized): Secondary | ICD-10-CM | POA: Diagnosis not present

## 2023-10-23 DIAGNOSIS — S46012D Strain of muscle(s) and tendon(s) of the rotator cuff of left shoulder, subsequent encounter: Secondary | ICD-10-CM | POA: Diagnosis not present

## 2023-10-23 DIAGNOSIS — M19012 Primary osteoarthritis, left shoulder: Secondary | ICD-10-CM | POA: Diagnosis not present

## 2023-10-23 DIAGNOSIS — M25612 Stiffness of left shoulder, not elsewhere classified: Secondary | ICD-10-CM | POA: Diagnosis not present

## 2023-10-30 DIAGNOSIS — S46012D Strain of muscle(s) and tendon(s) of the rotator cuff of left shoulder, subsequent encounter: Secondary | ICD-10-CM | POA: Diagnosis not present

## 2023-10-30 DIAGNOSIS — M25612 Stiffness of left shoulder, not elsewhere classified: Secondary | ICD-10-CM | POA: Diagnosis not present

## 2023-10-30 DIAGNOSIS — M6281 Muscle weakness (generalized): Secondary | ICD-10-CM | POA: Diagnosis not present

## 2023-10-30 DIAGNOSIS — M19012 Primary osteoarthritis, left shoulder: Secondary | ICD-10-CM | POA: Diagnosis not present

## 2023-11-16 ENCOUNTER — Encounter (HOSPITAL_BASED_OUTPATIENT_CLINIC_OR_DEPARTMENT_OTHER): Payer: Self-pay | Admitting: Family

## 2023-11-16 ENCOUNTER — Ambulatory Visit (INDEPENDENT_AMBULATORY_CARE_PROVIDER_SITE_OTHER): Admitting: Family

## 2023-11-16 VITALS — BP 130/82 | HR 72 | Ht 64.0 in | Wt 192.6 lb

## 2023-11-16 DIAGNOSIS — I493 Ventricular premature depolarization: Secondary | ICD-10-CM

## 2023-11-16 DIAGNOSIS — I700 Atherosclerosis of aorta: Secondary | ICD-10-CM | POA: Diagnosis not present

## 2023-11-16 DIAGNOSIS — I10 Essential (primary) hypertension: Secondary | ICD-10-CM | POA: Diagnosis not present

## 2023-11-16 NOTE — Progress Notes (Unsigned)
  Cardiology Office Note:  .   Date:  11/19/2023  ID:  Elaine Harris, DOB 15-Mar-1946, MRN [id number] PCP: Elliot Charm, MD  [facility] Providers Cardiologist:  None Cardiology APP:  Vannie Reche RAMAN, NP    History of Present Illness: .   Tattianna Schnarr Happ is a 78 y.o. female with hx of renal carcinoma s/p left partial nephrectomy 02/2020, aortic atherosclerosis, HTN, vitamin D deficiency, HLD, LAFB. She is a never smoker and does not drink alcohol.   Family history notable for heart attack in her father in his 17s and he eventually passed away from lung cancer.   Last seen 08/21/23. BP was well controlled and she was without angina. Clearance provided for shoulder surgery.   Presents today for follow up with her husband. Since last seen has had shoulder surgery and completed 12 weeks of outpatient PT. She still has some pain in her shoulder. Blood pressure at home has been 130s. Mild lightheadedness when she first gets out bed which is intermittent. Reports rare palpitations associated with [employer]. Reports no shortness of breath nor dyspnea on exertion. Plans to resume exercise by walking on her treadmill. Reports no chest pain, pressure, or tightness. No edema, orthopnea, PND. Reports no palpitations.  We reviewed prior discussion of coronary CTA due to family history, note upcoming CT chest per urology.   ROS: Please see the history of present illness.    All other systems reviewed and are negative.   Studies Reviewed: .         Risk Assessment/Calculations:            Physical Exam:  VS:  BP 130/82   Pulse 72   Ht 5' 4 (1.626 m)   Wt 192 lb 9.6 oz (87.4 kg)   SpO2 96%   BMI 33.06 kg/m    Wt Readings from Last 3 Encounters:  11/16/23 192 lb 9.6 oz (87.4 kg)  07/21/23 190 lb 3.2 oz (86.3 kg)  02/17/20 189 lb 3.2 oz (85.8 kg)    GEN: Well nourished, overweight, well developed in no acute distress NECK: No JVD; No carotid bruits CARDIAC: RRR, no  murmurs, rubs, gallops RESPIRATORY:  Clear to auscultation without rales, wheezing or rhonchi  ABDOMEN: Soft, non-tender, non-distended EXTREMITIES:  No edema; No deformity   ASSESSMENT AND PLAN: .    HTN - BP well controlled. Continue current antihypertensive regimen lisinopril 10 mg daily. Discussed to monitor BP at home at least 2 hours after medications and sitting for 5-10 minutes.   Aortic atherosclerosis / HLD / Family history of CAD -prior CT aortic atherosclerosis. Rosuvastatin 5mg  three times per week per PCP, continue same. Her father had MI in his 5s, no prior ischemic evaluation. No angina but interested in coronary calcification evaluation, considering cardiac CTA. Will discuss with her urologist what CT upcoming in January. If CT chest wo contrast could try to get cardiac CTA at same time  PVC-isolated PVC by EKG with PCP 07/09/2023.  Reports rare palpitations which self resolve which she associates with caffeine.  Is not bothersome, no plan for further workup. If more bothersome in future, consider ZIO monitor.  Encouraged to stay well-hydrated, limit caffeine.  LAFB - Noted on EKG dating back 12/2019. No concerning features such as lightheadedness, dizziness.  Monitor with periodic EKG.  Anticipate this is age-related.       Dispo: Follow up in 6 months withDr. Raford.  Signed, Reche RAMAN Vannie, NP

## 2023-11-16 NOTE — Patient Instructions (Addendum)
 Medication Instructions:  Continue your current medications  *If you need a refill on your cardiac medications before your next appointment, please call your pharmacy*  Follow-Up: At [facility], you and your health needs are our priority.  As part of our continuing mission to provide you with exceptional heart care, our providers are all part of one team.  This team includes your primary Cardiologist (physician) and Advanced Practice Providers or APPs (Physician Assistants and Nurse Practitioners) who all work together to provide you with the care you need, when you need it.  Your next appointment:   6 month(s)  Provider:   Annabella Scarce, MD, Rosaline Bane, NP, or Reche Finder, NP    We recommend signing up for the patient portal called MyChart.  Sign up information is provided on this After Visit Summary.  MyChart is used to connect with patients for Virtual Visits (Telemedicine).  Patients are able to view lab/test results, encounter notes, upcoming appointments, etc.  Non-urgent messages can be sent to your provider as well.   To learn more about what you can do with MyChart, go to [email or web address].   Other Instructions  Reche GORMAN Finder, NP will send note to Dr. Carolynn about possible cardiac CTA with your upcoming CT chest in January  Heart Healthy Diet Recommendations: A low-salt diet is recommended. Meats should be grilled, baked, or boiled. Avoid fried foods. Focus on lean protein sources like fish or chicken with vegetables and fruits. The American Heart Association is a Chief Technology Officer!  American Heart Association Diet and Lifeystyle Recommendations   Exercise recommendations: The American Heart Association recommends 150 minutes of moderate intensity exercise weekly. Try 30 minutes of moderate intensity exercise 4-5 times per week. This could include walking, jogging, or swimming. If you want referral to PREP exercise program let us  know!

## 2023-11-17 DIAGNOSIS — M19012 Primary osteoarthritis, left shoulder: Secondary | ICD-10-CM | POA: Diagnosis not present

## 2023-11-19 ENCOUNTER — Telehealth: Payer: Self-pay | Admitting: Family

## 2023-11-19 DIAGNOSIS — I700 Atherosclerosis of aorta: Secondary | ICD-10-CM

## 2023-11-19 DIAGNOSIS — E785 Hyperlipidemia, unspecified: Secondary | ICD-10-CM

## 2023-11-19 NOTE — Telephone Encounter (Signed)
 Spoke with patient and advised Reche NP out of office.  Advised would forward for review

## 2023-11-19 NOTE — Telephone Encounter (Signed)
 Pt is requesting cb to see if Reche was able to send a message to her Urologist Dr Carolynn regarding cardiac CTA. Requesting a cb from Kayla or Caitlin

## 2023-11-21 NOTE — Telephone Encounter (Signed)
 Have not heard from Dr. Carolynn. CT with Dr. Carolynn was not tenatively scheduled til January (Mrs. File was considering adding on cardiac CTA at that time). May not hear back for 1-2 weeks depending on Dr. Carolynn schedule. If she would like to proceed with cardiac CTA prior to her January CT with his team she may. Otherwise, can wait for Dr. Carolynn input.   Elaine Robel S Marilin Kofman, NP

## 2023-11-23 NOTE — Telephone Encounter (Signed)
Left detailed message, ok per DPR  

## 2023-12-03 NOTE — Telephone Encounter (Signed)
Pt calling back for an update  

## 2023-12-04 ENCOUNTER — Encounter (HOSPITAL_COMMUNITY): Payer: Self-pay

## 2023-12-04 NOTE — Telephone Encounter (Signed)
 Called and made patient aware per Reche Finder her CT will not show adequately images of coronary arteries and orders can  be placed for cardiac CTA. Patient verbalized she was in agreement with CTA. Order placed. Made patient aware that scheduling will reach out to her to get this scheduled. Understanding verbalized.

## 2023-12-04 NOTE — Telephone Encounter (Signed)
 Triage team - please call.  Her CT with Dr. Carolynn will not adequately image the coronary arteries. If she would like to have imaging of coronary arteries, can order cardiac CTA if she is interested.   Bhumi Godbey S Khali Perella, NP

## 2023-12-04 NOTE — Addendum Note (Signed)
 Addended by: TRUDY FRANKEY SAUNDERS on: 12/04/2023 10:19 AM   Modules accepted: Orders

## 2023-12-08 ENCOUNTER — Ambulatory Visit (HOSPITAL_COMMUNITY)
Admission: RE | Admit: 2023-12-08 | Discharge: 2023-12-08 | Disposition: A | Discharge status: Home / Self Care | Source: Ambulatory Visit | Attending: Family | Admitting: Family

## 2023-12-08 DIAGNOSIS — I700 Atherosclerosis of aorta: Secondary | ICD-10-CM | POA: Insufficient documentation

## 2023-12-08 DIAGNOSIS — E785 Hyperlipidemia, unspecified: Secondary | ICD-10-CM | POA: Insufficient documentation

## 2023-12-08 MED ORDER — NITROGLYCERIN 0.4 MG SL SUBL: 0.8000 mg | SUBLINGUAL_TABLET | Freq: Once | SUBLINGUAL | Status: DC

## 2023-12-08 MED ORDER — IOHEXOL 350 MG/ML SOLN
100.0000 mL | Freq: Once | INTRAVENOUS | Status: AC | PRN
  Administered 2023-12-08: 100 mL via INTRAVENOUS

## 2023-12-09 ENCOUNTER — Ambulatory Visit (HOSPITAL_BASED_OUTPATIENT_CLINIC_OR_DEPARTMENT_OTHER): Payer: Self-pay | Admitting: Family

## 2024-04-25 DIAGNOSIS — E785 Hyperlipidemia, unspecified: Secondary | ICD-10-CM | POA: Diagnosis not present

## 2024-04-25 DIAGNOSIS — Z1331 Encounter for screening for depression: Secondary | ICD-10-CM | POA: Diagnosis not present

## 2024-04-25 DIAGNOSIS — E2839 Other primary ovarian failure: Secondary | ICD-10-CM | POA: Diagnosis not present

## 2024-04-25 DIAGNOSIS — I10 Essential (primary) hypertension: Secondary | ICD-10-CM | POA: Diagnosis not present

## 2024-04-25 DIAGNOSIS — Z23 Encounter for immunization: Secondary | ICD-10-CM | POA: Diagnosis not present

## 2024-04-25 DIAGNOSIS — R7303 Prediabetes: Secondary | ICD-10-CM | POA: Diagnosis not present

## 2024-04-25 DIAGNOSIS — Z0000 Encounter for general adult medical examination without abnormal findings: Secondary | ICD-10-CM | POA: Diagnosis not present

## 2024-04-25 DIAGNOSIS — E559 Vitamin D deficiency, unspecified: Secondary | ICD-10-CM | POA: Diagnosis not present

## 2024-04-26 ENCOUNTER — Other Ambulatory Visit (HOSPITAL_BASED_OUTPATIENT_CLINIC_OR_DEPARTMENT_OTHER): Payer: Self-pay | Admitting: Internal Medicine

## 2024-04-26 DIAGNOSIS — E2839 Other primary ovarian failure: Secondary | ICD-10-CM

## 2024-05-09 ENCOUNTER — Ambulatory Visit (INDEPENDENT_AMBULATORY_CARE_PROVIDER_SITE_OTHER): Admitting: Cardiovascular Disease

## 2024-05-09 ENCOUNTER — Encounter (HOSPITAL_BASED_OUTPATIENT_CLINIC_OR_DEPARTMENT_OTHER): Payer: Self-pay | Admitting: Cardiovascular Disease

## 2024-05-09 VITALS — BP 174/90 | HR 64 | Ht 61.0 in | Wt 199.6 lb

## 2024-05-09 DIAGNOSIS — I10 Essential (primary) hypertension: Secondary | ICD-10-CM | POA: Insufficient documentation

## 2024-05-09 DIAGNOSIS — E7800 Pure hypercholesterolemia, unspecified: Secondary | ICD-10-CM | POA: Insufficient documentation

## 2024-05-09 DIAGNOSIS — I2510 Atherosclerotic heart disease of native coronary artery without angina pectoris: Secondary | ICD-10-CM | POA: Diagnosis not present

## 2024-05-09 NOTE — Progress Notes (Signed)
 " Cardiology Office Note:  .   Date:  05/09/2024  ID:  Elaine Harris, DOB 1945-12-19, MRN [id number] PCP: Elliot Charm, MD  [facility] Providers Cardiologist:  None Cardiology APP:  Vannie Reche RAMAN, NP    History of Present Illness: .   Elaine Harris is a 78 y.o. female with hypertension, hyperlipidemia, aortic atherosclerosis, and RCC s/p partial L nephrectomy here for follow up.  She previously saw Reche Vannie, NP for family history of CAD and pre-operative clearance.  She had PVCs on EKG with her PCP and had been started on rosuvastatin. Coronary CT-A revealed minimal soft plaque and no calcified plaque.  Discussed the use of AI scribe software for clinical note transcription with the patient, who gave verbal consent to proceed.  History of Present Illness Elaine Harris has ongoing shoulder pain following a fall at the beginning of the year, resulting in a complete rotator cuff tear. She underwent surgery and subsequent physical therapy. Despite therapy, she continues to experience pain and limited range of motion, particularly when attempting to move her arm behind her back. She performs stretching exercises occasionally, especially when the pain intensifies.  Her blood pressure is typically well-controlled at home, averaging around 135/80 mmHg, but it tends to rise during doctor visits due to stress. She monitors her blood pressure regularly and takes lisinopril 20 mg daily. She also takes rosuvastatin three times a week for cholesterol management.  She is prediabetic and is concerned about her cholesterol levels. She reports having had a recent CT scan and recalls being told there was minimal plaque, but does not recall the specific results. She lacks regular exercise but wants to use her treadmill more consistently. No issues with breathing or swelling in her legs and feet, although she occasionally has nasal congestion during winter.  She recalls recent  lab work in December and states that everything was fine except for her prediabetes, and she did not see anything that was high other than that.  ROS:  As per HPI  Studies Reviewed: SABRA       Coronary CT-A 11/2023: IMPRESSION: 1. Coronary calcium score of 0. This was 0 percentile for age-, sex, and race-matched controls.   2. Total plaque volume 23 mm3 which is 7th percentile for age- and sex-matched controls (calcified plaque 44mm3; non-calcified plaque 73mm3). TPV is mild.   3. Normal coronary origin with right dominance.   4. Normal coronary arteries that were visualized. Significant noise artifact limited ability to assess the mid and distal LCx and OM as well as the mid LAD.   5. Consider other noninvasive imaging modalities including Stress PET CT or nuclear stress testing if symptoms worrisome for ischemia.  Risk Assessment/Calculations:     HYPERTENSION CONTROL Vitals:   05/09/24 0813 05/09/24 0817 05/09/24 0841  BP: (!) 174/66 (!) 170/72 (!) 174/90    The patient's blood pressure is elevated above target today.  In order to address the patient's elevated BP: The blood pressure is usually elevated in clinic.  Blood pressures monitored at home have been optimal.          Physical Exam:   VS:  BP (!) 174/90   Pulse 64   Ht 5' 1 (1.549 m)   Wt 199 lb 9.6 oz (90.5 kg)   SpO2 95%   BMI 37.71 kg/m  , BMI Body mass index is 37.71 kg/m. GENERAL:  Well appearing HEENT: Pupils equal round and reactive, fundi not visualized, oral mucosa unremarkable  NECK:  No jugular venous distention, waveform within normal limits, carotid upstroke brisk and symmetric, no bruits, no thyromegaly LUNGS:  Clear to auscultation bilaterally HEART:  RRR.  PMI not displaced or sustained,S1 and S2 within normal limits, no S3, no S4, no clicks, no rubs, no murmurs ABD:  Flat, positive bowel sounds normal in frequency in pitch, no bruits, no rebound, no guarding, no midline pulsatile mass, no  hepatomegaly, no splenomegaly EXT:  2 plus pulses throughout, no edema, no cyanosis no clubbing SKIN:  No rashes no nodules NEURO:  Cranial nerves II through XII grossly intact, motor grossly intact throughout PSYCH:  Cognitively intact, oriented to person place and time   ASSESSMENT AND PLAN: .    Assessment & Plan # Hypertension Blood pressure elevated in office likely due to white coat syndrome. Home readings average 135/80 mmHg. Current management with lisinopril 20 mg daily is appropriate.  BP goal is <130/80. - Continue lisinopril 20 mg daily. - Encouraged regular exercise. - Monitor blood pressure at home and report if consistently above 130/80 mmHg.  # Coronary artery disease with non-obstructive atherosclerosis CT scan shows minimal soft plaque with less than 25% blockage. No calcified plaque. Condition in prevention phase. - Continue current management to prevent progression. - Maintain cholesterol control with rosuvastatin. - Encouraged regular exercise and lifestyle modifications.  # Hyperlipidemia Managed with rosuvastatin three times a week. Total cholesterol and HDL within acceptable ranges. LDL levels not available.  LDL goal <70. - Continue rosuvastatin three times a week. - Will obtain LDL levels from recent labs if not available.  # Prediabetes Condition well-managed with no recent changes in A1c levels. - Continue monitoring A1c levels. - Encouraged exercise.    Dispo: f/u 1 year  Signed, Annabella Scarce, MD   "

## 2024-05-09 NOTE — Patient Instructions (Signed)
 Medication Instructions:  Your physician recommends that you continue on your current medications as directed. Please refer to the Current Medication list given to you today.   *If you need a refill on your cardiac medications before your next appointment, please call your pharmacy*  Lab Work: NONE  Testing/Procedures: NONE  Follow-Up: At [facility], you and your health needs are our priority.  As part of our continuing mission to provide you with exceptional heart care, our providers are all part of one team.  This team includes your primary Cardiologist (physician) and Advanced Practice Providers or APPs (Physician Assistants and Nurse Practitioners) who all work together to provide you with the care you need, when you need it.  Your next appointment:   12 month(s)  Provider:   Maudine Sos, MD    We recommend signing up for the patient portal called MyChart.  Sign up information is provided on this After Visit Summary.  MyChart is used to connect with patients for Virtual Visits (Telemedicine).  Patients are able to view lab/test results, encounter notes, upcoming appointments, etc.  Non-urgent messages can be sent to your provider as well.   To learn more about what you can do with MyChart, go to [email or web address].
